# Patient Record
Sex: Female | Born: 1975
Health system: Southern US, Community
[De-identification: ages and names within clinical notes are randomized; demographics above are authoritative.]

## PROBLEM LIST (undated history)

## (undated) DIAGNOSIS — T7840XA Allergy, unspecified, initial encounter: Secondary | ICD-10-CM

## (undated) DIAGNOSIS — D696 Thrombocytopenia, unspecified: Secondary | ICD-10-CM

## (undated) DIAGNOSIS — O99345 Other mental disorders complicating the puerperium: Secondary | ICD-10-CM

## (undated) DIAGNOSIS — O99119 Other diseases of the blood and blood-forming organs and certain disorders involving the immune mechanism complicating pregnancy, unspecified trimester: Secondary | ICD-10-CM

## (undated) DIAGNOSIS — B009 Herpesviral infection, unspecified: Secondary | ICD-10-CM

## (undated) DIAGNOSIS — F53 Postpartum depression: Secondary | ICD-10-CM

## (undated) HISTORY — PX: MOUTH SURGERY: SHX715

## (undated) HISTORY — DX: Other mental disorders complicating the puerperium: O99.345

## (undated) HISTORY — DX: Postpartum depression: F53.0

## (undated) HISTORY — PX: WISDOM TOOTH EXTRACTION: SHX21

## (undated) HISTORY — DX: Thrombocytopenia, unspecified: D69.6

## (undated) HISTORY — DX: Other diseases of the blood and blood-forming organs and certain disorders involving the immune mechanism complicating pregnancy, unspecified trimester: O99.119

## (undated) HISTORY — DX: Allergy, unspecified, initial encounter: T78.40XA

---

## 1999-02-08 ENCOUNTER — Encounter: Payer: Self-pay | Admitting: Family Medicine

## 1999-02-08 ENCOUNTER — Ambulatory Visit (HOSPITAL_COMMUNITY): Admission: RE | Admit: 1999-02-08 | Discharge: 1999-02-08 | Payer: Self-pay | Admitting: Family Medicine

## 2002-12-14 ENCOUNTER — Emergency Department (HOSPITAL_COMMUNITY): Admission: EM | Admit: 2002-12-14 | Discharge: 2002-12-14 | Payer: Self-pay | Admitting: Emergency Medicine

## 2005-08-04 ENCOUNTER — Other Ambulatory Visit: Admission: RE | Admit: 2005-08-04 | Discharge: 2005-08-04 | Payer: Self-pay | Admitting: Gynecology

## 2006-12-11 ENCOUNTER — Other Ambulatory Visit: Admission: RE | Admit: 2006-12-11 | Discharge: 2006-12-11 | Payer: Self-pay | Admitting: Gynecology

## 2010-09-05 NOTE — L&D Delivery Note (Signed)
Delivery Note  Pt began to feel pressure, after epidural placed. Cervix complete, AROM for light mec stained fluid. FHR 130's, pt began pushing and vertex descended, FHR variables noted.  Vertex delivered, and shoulders followed with McRoberts position.   At 5:06 AM a viable female was delivered via Vaginal, Spontaneous Delivery (Presentation: ;Left  Occiput Anterior).  APGAR: 8, 9; weight 7 lb 13 oz (3544 g).   Placenta status: Intact, Spontaneous.  Cord: 3 vessels with the following complications: None.  Routine cord blood collected, Placenta sent to pathology, secondary to mec staining, and hydronephritis  Infant with spontaneous respirations, bulb suction for moderate mec stained fluid, cord and placenta stained as well.   Anesthesia: Epidural  Episiotomy: None Lacerations: bilateral labial Suture Repair: 3.0 vicryl rapide Est. Blood Loss (mL): 300  Mom to postpartum.  Baby to nursery-stable  Dr Stefano Gaul notified  Malissa Hippo 06/02/2011, 5:41 AM

## 2010-11-17 LAB — GC/CHLAMYDIA PROBE AMP, GENITAL
Chlamydia: NEGATIVE
Gonorrhea: NEGATIVE

## 2010-11-17 LAB — RUBELLA ANTIBODY, IGM: Rubella: IMMUNE

## 2010-11-17 LAB — HIV ANTIBODY (ROUTINE TESTING W REFLEX): HIV: NONREACTIVE

## 2011-05-24 ENCOUNTER — Other Ambulatory Visit (HOSPITAL_COMMUNITY): Payer: Self-pay | Admitting: Obstetrics and Gynecology

## 2011-05-24 DIAGNOSIS — O2690 Pregnancy related conditions, unspecified, unspecified trimester: Secondary | ICD-10-CM

## 2011-05-25 ENCOUNTER — Ambulatory Visit (HOSPITAL_COMMUNITY)
Admission: RE | Admit: 2011-05-25 | Discharge: 2011-05-25 | Disposition: A | Discharge status: Home / Self Care | Payer: Managed Care, Other (non HMO) | Source: Ambulatory Visit | Attending: Obstetrics and Gynecology | Admitting: Obstetrics and Gynecology

## 2011-05-25 ENCOUNTER — Encounter (HOSPITAL_COMMUNITY): Payer: Self-pay

## 2011-05-25 DIAGNOSIS — O358XX0 Maternal care for other (suspected) fetal abnormality and damage, not applicable or unspecified: Secondary | ICD-10-CM | POA: Insufficient documentation

## 2011-05-25 DIAGNOSIS — O2690 Pregnancy related conditions, unspecified, unspecified trimester: Secondary | ICD-10-CM

## 2011-05-31 ENCOUNTER — Other Ambulatory Visit: Payer: Self-pay | Admitting: Obstetrics and Gynecology

## 2011-05-31 ENCOUNTER — Encounter (HOSPITAL_COMMUNITY): Payer: Self-pay | Admitting: *Deleted

## 2011-05-31 ENCOUNTER — Inpatient Hospital Stay (HOSPITAL_COMMUNITY)
Admission: AD | Admit: 2011-05-31 | Discharge: 2011-05-31 | Disposition: A | Discharge status: Home / Self Care | Payer: Managed Care, Other (non HMO) | Source: Ambulatory Visit | Attending: Obstetrics and Gynecology | Admitting: Obstetrics and Gynecology

## 2011-05-31 ENCOUNTER — Inpatient Hospital Stay (HOSPITAL_COMMUNITY): Payer: Managed Care, Other (non HMO)

## 2011-05-31 DIAGNOSIS — O288 Other abnormal findings on antenatal screening of mother: Secondary | ICD-10-CM

## 2011-05-31 DIAGNOSIS — O368190 Decreased fetal movements, unspecified trimester, not applicable or unspecified: Secondary | ICD-10-CM | POA: Insufficient documentation

## 2011-05-31 NOTE — Progress Notes (Signed)
Pt sent over from office for non-reactive NST and decreased fetal movement.  Pt denies any bleeding, leaking of fluid, or pain.

## 2011-05-31 NOTE — ED Provider Notes (Signed)
History     Chief Complaint  Patient presents with  . Decreased Fetal Movement   HPI Comments: Pt was sent from office after a non-reactive NST and BPP 6/8 (-2 for breathing). Pt states baby was not moving well when seen at office.  She denies ctx, VB, LOF, has +FM now. Infant has hydronephritis and will be followed by pediactrics after birth.  Pt has a hx of HSV II   She denies ctx, VB, LOF, has +FM now. Infant has hydronephritis and will be followed by pediactrics after birth.     No past medical history on file.  Past Surgical History  Procedure Date  . Wisdom tooth extraction     Family History  Problem Relation Age of Onset  . Asthma Mother   . Hypertension Mother   . Hypertension Father     History  Substance Use Topics  . Smoking status: Former Games developer  . Smokeless tobacco: Not on file  . Alcohol Use: No    Allergies: No Known Allergies  Prescriptions prior to admission  Medication Sig Dispense Refill  . Calcium Carbonate Antacid (TUMS ULTRA 1000 PO) Take 1-2 tablets by mouth at bedtime as needed. For heartburn        . Cholecalciferol (VITAMIN D3) LIQD 3 drops by Does not apply route daily. Pure encapsulations vitamin d3 liquid, take 3 drops daily (1,000 units/drop)       . famciclovir (FAMVIR) 250 MG tablet Take 250 mg by mouth 2 (two) times daily.       . prenatal vitamin w/FE, FA (PRENATAL 1 + 1) 27-1 MG TABS Take 1 tablet by mouth daily.          Review of Systems  All other systems reviewed and are negative.   Physical Exam   Blood pressure 112/72, pulse 80, resp. rate 18, height 5\' 3"  (1.6 m), weight 64.411 kg (142 lb), last menstrual period 08/24/2010.  Physical Exam  Nursing note and vitals reviewed. Constitutional: She is oriented to person, place, and time. She appears well-developed and well-nourished.  Respiratory: Effort normal.  GI: Soft.  Neurological: She is alert and oriented to person, place, and time.  Skin: Skin is warm and dry.    Psychiatric: She has a normal mood and affect. Her behavior is normal.   FHR 150 reactive, mod variability, occ mild variable, CAT I toco irreg  VE deferred    MAU Course  Procedures NST BPP   Assessment and Plan  G2P0 at 39.6  Non-reactive NST at office FHR now reactive, will repeat BPP if WNL will D/C home Pt has F/U visit next Tuesday  Dr Su Hilt consulted  Jnyah Brazee M 05/31/2011, 4:33 PM    BPP 8/8 NST reactive FHR CAT I  Pt given FKC and labor precautions D/C home  Update Dr. Su Hilt

## 2011-06-01 ENCOUNTER — Inpatient Hospital Stay (HOSPITAL_COMMUNITY)
Admission: AD | Admit: 2011-06-01 | Discharge: 2011-06-01 | Disposition: A | Discharge status: Home / Self Care | Payer: Managed Care, Other (non HMO) | Source: Ambulatory Visit | Attending: Obstetrics and Gynecology | Admitting: Obstetrics and Gynecology

## 2011-06-01 ENCOUNTER — Encounter (HOSPITAL_COMMUNITY): Payer: Self-pay | Admitting: *Deleted

## 2011-06-01 DIAGNOSIS — O479 False labor, unspecified: Secondary | ICD-10-CM | POA: Insufficient documentation

## 2011-06-01 MED ORDER — ZOLPIDEM TARTRATE 10 MG PO TABS
10.0000 mg | ORAL_TABLET | Freq: Once | ORAL | Status: AC
  Administered 2011-06-01: 10 mg via ORAL
  Filled 2011-06-01: qty 1

## 2011-06-01 MED ORDER — ZOLPIDEM TARTRATE 10 MG PO TABS: 10.0000 mg | ORAL_TABLET | Freq: Every evening | ORAL | Status: DC | PRN

## 2011-06-01 NOTE — ED Provider Notes (Signed)
History     CSN: [id number] Arrival date & time: 06/01/2011  7:58 PM  No chief complaint on file.   (Consider location/radiation/quality/duration/timing/severity/associated sxs/prior treatment) HPI Comments: Pt arrived unannounced for a labor check. States ctx started about 1730 and are getting stronger. Denies VB, LOF, +FM Pregnancy is significant for hx HSVII, on valtrex, no prodromal sx's or outbreak at this time.    History reviewed. No pertinent past medical history.  Past Surgical History  Procedure Date  . Wisdom tooth extraction   . No past surgeries     Family History  Problem Relation Age of Onset  . Asthma Mother   . Hypertension Mother   . Hypertension Father     History  Substance Use Topics  . Smoking status: Former Games developer  . Smokeless tobacco: Not on file  . Alcohol Use: No    OB History    Grav Para Term Preterm Abortions TAB SAB Ect Mult Living   2 0 0 0 1 1 0 0 0 0       Review of Systems  All other systems reviewed and are negative.    Allergies  Review of patient's allergies indicates no known allergies.  Home Medications   Current Outpatient Rx  Name Route Sig Dispense Refill  . ZOLPIDEM TARTRATE 10 MG PO TABS Oral Take 1 tablet (10 mg total) by mouth at bedtime as needed for sleep. 30 tablet 0   Error, RX for ambien NOT given  BP 111/78  Pulse 72  Temp(Src) 97.5 F (36.4 C) (Oral)  Resp 18  Ht 5\' 3"  (1.6 m)  Wt 64.864 kg (143 lb)  BMI 25.33 kg/m2  LMP 08/24/2010  Physical Exam  Nursing note and vitals reviewed. Constitutional: She is oriented to person, place, and time. She appears well-developed and well-nourished.  Abdominal: Soft.  Genitourinary: Vagina normal.  Neurological: She is alert and oriented to person, place, and time.  Skin: Skin is warm and dry.  Psychiatric: She has a normal mood and affect. Her behavior is normal.    FHR 150 reactive CAT I toco irregular 4-6 VE =2/80/-1 No change after 1.5  hours    ED Course  Procedures NST VE  Labs Reviewed - No data to display        MDM  A: term IUP early/prodrome labor Hx HSVII no active lesions FHR reactive  P: D/C home ambien 10mg  now F/u routine

## 2011-06-01 NOTE — Progress Notes (Signed)
Patient states she started counting contractions about 1800 and pt states they slowed down about 3 0 mins ago but they are still coming

## 2011-06-01 NOTE — Progress Notes (Signed)
PT SAYS SHE WAS HERE YESTERDAY- EVALUATED- U/S AND SENT HOME.   VE 2 CM- X2 WEEKS.    SAYS HURT BAD AT 530PM.    CALLED SHELLEY AND LEFT MESSAGE.Marland Kitchen  SAYS BABY'S KIDNEY IS ABNL

## 2011-06-02 ENCOUNTER — Encounter (HOSPITAL_COMMUNITY): Payer: Self-pay | Admitting: Anesthesiology

## 2011-06-02 ENCOUNTER — Inpatient Hospital Stay (HOSPITAL_COMMUNITY)
Admission: AD | Admit: 2011-06-02 | Discharge: 2011-06-04 | DRG: 775 | Disposition: A | Discharge status: Home / Self Care | Payer: Managed Care, Other (non HMO) | Source: Ambulatory Visit | Attending: Obstetrics and Gynecology | Admitting: Obstetrics and Gynecology

## 2011-06-02 ENCOUNTER — Encounter (HOSPITAL_COMMUNITY): Payer: Self-pay | Admitting: *Deleted

## 2011-06-02 ENCOUNTER — Inpatient Hospital Stay (HOSPITAL_COMMUNITY): Payer: Managed Care, Other (non HMO) | Admitting: Anesthesiology

## 2011-06-02 ENCOUNTER — Other Ambulatory Visit: Payer: Self-pay | Admitting: Obstetrics and Gynecology

## 2011-06-02 LAB — CBC
Hemoglobin: 13.8 g/dL (ref 12.0–15.0)
MCH: 35.4 pg — ABNORMAL HIGH (ref 26.0–34.0)
RBC: 3.9 MIL/uL (ref 3.87–5.11)
WBC: 11.7 10*3/uL — ABNORMAL HIGH (ref 4.0–10.5)

## 2011-06-02 LAB — RPR: RPR Ser Ql: NONREACTIVE

## 2011-06-02 MED ORDER — IBUPROFEN 600 MG PO TABS
600.0000 mg | ORAL_TABLET | Freq: Four times a day (QID) | ORAL | Status: DC
  Administered 2011-06-02 – 2011-06-04 (×8): 600 mg via ORAL
  Filled 2011-06-02 (×5): qty 1

## 2011-06-02 MED ORDER — ZOLPIDEM TARTRATE 5 MG PO TABS: 5.0000 mg | ORAL_TABLET | Freq: Every evening | ORAL | Status: DC | PRN

## 2011-06-02 MED ORDER — FENTANYL CITRATE 0.05 MG/ML IJ SOLN
50.0000 ug | Freq: Once | INTRAMUSCULAR | Status: AC
  Administered 2011-06-02: 50 ug via INTRAVENOUS
  Filled 2011-06-02: qty 2

## 2011-06-02 MED ORDER — FENTANYL 2.5 MCG/ML BUPIVACAINE 1/10 % EPIDURAL INFUSION (WH - ANES)
14.0000 mL/h | INTRAMUSCULAR | Status: DC
  Administered 2011-06-02: 2 mL/h via EPIDURAL
  Administered 2011-06-02: 14 mL/h via EPIDURAL
  Filled 2011-06-02: qty 60

## 2011-06-02 MED ORDER — SENNOSIDES-DOCUSATE SODIUM 8.6-50 MG PO TABS
2.0000 | ORAL_TABLET | Freq: Every day | ORAL | Status: DC
  Administered 2011-06-03: 2 via ORAL

## 2011-06-02 MED ORDER — WITCH HAZEL-GLYCERIN EX PADS: 1.0000 "application " | MEDICATED_PAD | CUTANEOUS | Status: DC | PRN

## 2011-06-02 MED ORDER — MEASLES, MUMPS & RUBELLA VAC ~~LOC~~ INJ
0.5000 mL | INJECTION | Freq: Once | SUBCUTANEOUS | Status: DC
  Filled 2011-06-02: qty 0.5

## 2011-06-02 MED ORDER — ONDANSETRON HCL 4 MG PO TABS: 4.0000 mg | ORAL_TABLET | ORAL | Status: DC | PRN

## 2011-06-02 MED ORDER — DIPHENHYDRAMINE HCL 25 MG PO CAPS
25.0000 mg | ORAL_CAPSULE | Freq: Four times a day (QID) | ORAL | Status: DC | PRN
Start: 2011-06-02 — End: 2011-06-04

## 2011-06-02 MED ORDER — LANOLIN HYDROUS EX OINT: TOPICAL_OINTMENT | CUTANEOUS | Status: DC | PRN

## 2011-06-02 MED ORDER — OXYCODONE-ACETAMINOPHEN 5-325 MG PO TABS: 1.0000 | ORAL_TABLET | ORAL | Status: DC | PRN

## 2011-06-02 MED ORDER — CITRIC ACID-SODIUM CITRATE 334-500 MG/5ML PO SOLN: 30.0000 mL | ORAL | Status: DC | PRN

## 2011-06-02 MED ORDER — ONDANSETRON HCL 4 MG/2ML IJ SOLN: 4.0000 mg | INTRAMUSCULAR | Status: DC | PRN

## 2011-06-02 MED ORDER — OXYTOCIN BOLUS FROM INFUSION
500.0000 mL | Freq: Once | INTRAVENOUS | Status: AC
  Administered 2011-06-02: 500 mL via INTRAVENOUS
  Filled 2011-06-02: qty 500
  Filled 2011-06-02: qty 1000

## 2011-06-02 MED ORDER — EPHEDRINE 5 MG/ML INJ
10.0000 mg | INTRAVENOUS | Status: DC | PRN
  Filled 2011-06-02 (×2): qty 4

## 2011-06-02 MED ORDER — TETANUS-DIPHTH-ACELL PERTUSSIS 5-2.5-18.5 LF-MCG/0.5 IM SUSP
0.5000 mL | Freq: Once | INTRAMUSCULAR | Status: AC
  Administered 2011-06-03: 0.5 mL via INTRAMUSCULAR
  Filled 2011-06-02: qty 0.5

## 2011-06-02 MED ORDER — DIBUCAINE 1 % RE OINT: 1.0000 "application " | TOPICAL_OINTMENT | RECTAL | Status: DC | PRN

## 2011-06-02 MED ORDER — LACTATED RINGERS IV SOLN: 500.0000 mL | Freq: Once | INTRAVENOUS | Status: DC

## 2011-06-02 MED ORDER — OXYCODONE-ACETAMINOPHEN 5-325 MG PO TABS: 2.0000 | ORAL_TABLET | ORAL | Status: DC | PRN

## 2011-06-02 MED ORDER — BENZOCAINE-MENTHOL 20-0.5 % EX AERO
INHALATION_SPRAY | CUTANEOUS | Status: AC
  Administered 2011-06-02: 1 via TOPICAL
  Filled 2011-06-02: qty 56

## 2011-06-02 MED ORDER — IBUPROFEN 600 MG PO TABS
600.0000 mg | ORAL_TABLET | Freq: Four times a day (QID) | ORAL | Status: DC | PRN
  Administered 2011-06-02: 600 mg via ORAL
  Filled 2011-06-02 (×3): qty 1

## 2011-06-02 MED ORDER — DIPHENHYDRAMINE HCL 50 MG/ML IJ SOLN
12.5000 mg | INTRAMUSCULAR | Status: DC | PRN
Start: 2011-06-02 — End: 2011-06-04

## 2011-06-02 MED ORDER — LIDOCAINE HCL (PF) 1 % IJ SOLN
30.0000 mL | INTRAMUSCULAR | Status: AC | PRN
  Administered 2011-06-02: 30 mL via SUBCUTANEOUS
  Filled 2011-06-02: qty 30

## 2011-06-02 MED ORDER — SIMETHICONE 80 MG PO CHEW: 80.0000 mg | CHEWABLE_TABLET | ORAL | Status: DC | PRN

## 2011-06-02 MED ORDER — PHENYLEPHRINE 40 MCG/ML (10ML) SYRINGE FOR IV PUSH (FOR BLOOD PRESSURE SUPPORT)
80.0000 ug | PREFILLED_SYRINGE | INTRAVENOUS | Status: DC | PRN
  Filled 2011-06-02 (×2): qty 5

## 2011-06-02 MED ORDER — ACETAMINOPHEN 325 MG PO TABS: 650.0000 mg | ORAL_TABLET | ORAL | Status: DC | PRN

## 2011-06-02 MED ORDER — OXYTOCIN 10 UNIT/ML IJ SOLN: 10.0000 [IU] | Freq: Once | INTRAMUSCULAR | Status: DC

## 2011-06-02 MED ORDER — BENZOCAINE-MENTHOL 20-0.5 % EX AERO
1.0000 "application " | INHALATION_SPRAY | CUTANEOUS | Status: DC | PRN
  Administered 2011-06-02: 1 via TOPICAL

## 2011-06-02 MED ORDER — PHENYLEPHRINE 40 MCG/ML (10ML) SYRINGE FOR IV PUSH (FOR BLOOD PRESSURE SUPPORT)
80.0000 ug | PREFILLED_SYRINGE | INTRAVENOUS | Status: DC | PRN
  Filled 2011-06-02: qty 5

## 2011-06-02 MED ORDER — OXYTOCIN 20 UNITS IN LACTATED RINGERS INFUSION - SIMPLE: 125.0000 mL/h | Freq: Once | INTRAVENOUS | Status: DC

## 2011-06-02 MED ORDER — PRENATAL PLUS 27-1 MG PO TABS
1.0000 | ORAL_TABLET | Freq: Every day | ORAL | Status: DC
  Filled 2011-06-02: qty 1

## 2011-06-02 MED ORDER — ONDANSETRON HCL 4 MG/2ML IJ SOLN: 4.0000 mg | Freq: Four times a day (QID) | INTRAMUSCULAR | Status: DC | PRN

## 2011-06-02 MED ORDER — LACTATED RINGERS IV SOLN
INTRAVENOUS | Status: DC
  Administered 2011-06-02: 02:00:00 via INTRAVENOUS

## 2011-06-02 MED ORDER — LACTATED RINGERS IV SOLN
500.0000 mL | INTRAVENOUS | Status: DC | PRN
  Administered 2011-06-02: 500 mL via INTRAVENOUS

## 2011-06-02 MED ORDER — EPHEDRINE 5 MG/ML INJ
10.0000 mg | INTRAVENOUS | Status: DC | PRN
  Filled 2011-06-02: qty 4

## 2011-06-02 NOTE — Progress Notes (Signed)
Shelley Williams, CNM at bedside.  Assessment done. ve done.  poc discussed with pt.

## 2011-06-02 NOTE — Anesthesia Postprocedure Evaluation (Signed)
  Anesthesia Post-op Note  Patient: Shelley Williams  Procedure(s) Performed: * Lumbar Epidural for L&D *  Patient Location: Mother/Baby  Anesthesia Type: Epidural  Level of Consciousness: awake, alert  and oriented  Airway and Oxygen Therapy: Patient Spontanous Breathing  Post-op Pain: none  Post-op Assessment: Post-op Vital signs reviewed, Patient's Cardiovascular Status Stable, Respiratory Function Stable, Patent Airway, No signs of Nausea or vomiting, Adequate PO intake, Pain level controlled, No headache, No backache, No residual numbness and No residual motor weakness  Post-op Vital Signs: Reviewed and stable  Complications: No apparent anesthesia complications

## 2011-06-02 NOTE — Progress Notes (Signed)
Post Partum Day 0 Subjective: no complaints, up ad lib, voiding and tolerating PO. Bottlefeeding. Significant other incarcerated for the next three years. Patient likely to decline contraception.  Objective: Blood pressure 91/54, pulse 82, temperature 97.6 F (36.4 C), temperature source Oral, resp. rate 20, last menstrual period 08/24/2010, SpO2 100.00%, unknown if currently breastfeeding.  Physical Exam:  General: alert Lochia: appropriate Uterine Fundus: firm Incision: bilateral labials DVT Evaluation: No evidence of DVT seen on physical exam. Negative Homan's sign.   Basename 06/02/11 0135  HGB 13.8  HCT 39.3    Assessment/Plan: Day 0 postpartum stable. Continue current care.   LOS: 0 days   Nigel Bridgeman 06/02/2011, 10:52 AM

## 2011-06-02 NOTE — Progress Notes (Signed)
Pt presents to mau for labor check.  Left earlier and was 2cm.

## 2011-06-02 NOTE — Anesthesia Preprocedure Evaluation (Signed)
Anesthesia Evaluation  Name, MR# and DOB Patient awake  General Assessment Comment  Reviewed: Allergy & Precautions, H&P , NPO status , Patient's Chart, lab work & pertinent test results, reviewed documented beta blocker date and time   History of Anesthesia Complications Negative for: history of anesthetic complications  Airway Mallampati: III TM Distance: >3 FB Neck ROM: full    Dental  (+) Teeth Intact   Pulmonary  clear to auscultation  breath sounds clear to auscultation none    Cardiovascular regular Normal    Neuro/Psych Negative Neurological ROS  Negative Psych ROS  GI/Hepatic/Renal negative GI ROS  negative Liver ROS  negative Renal ROS        Endo/Other  Negative Endocrine ROS (+)      Abdominal   Musculoskeletal   Hematology negative hematology ROS (+)   Peds  Reproductive/Obstetrics (+) Pregnancy    Anesthesia Other Findings             Anesthesia Physical Anesthesia Plan  ASA: II  Anesthesia Plan: Epidural   Post-op Pain Management:    Induction:   Airway Management Planned:   Additional Equipment:   Intra-op Plan:   Post-operative Plan:   Informed Consent: I have reviewed the patients History and Physical, chart, labs and discussed the procedure including the risks, benefits and alternatives for the proposed anesthesia with the patient or authorized representative who has indicated his/her understanding and acceptance.     Plan Discussed with:   Anesthesia Plan Comments:         Anesthesia Quick Evaluation  

## 2011-06-02 NOTE — Anesthesia Procedure Notes (Signed)
Epidural Patient location during procedure: OB Start time: 06/02/2011 3:06 AM Reason for block: procedure for pain  Staffing Performed by: anesthesiologist   Preanesthetic Checklist Completed: patient identified, site marked, surgical consent, pre-op evaluation, timeout performed, IV checked, risks and benefits discussed and monitors and equipment checked  Epidural Patient position: sitting Prep: site prepped and draped and DuraPrep Patient monitoring: continuous pulse ox and blood pressure Approach: midline Injection technique: LOR air  Needle:  Needle type: Tuohy  Needle gauge: 17 G Needle length: 9 cm Needle insertion depth: 5 cm cm Catheter type: closed end flexible Catheter size: 19 Gauge Catheter at skin depth: 10 cm Test dose: negative  Assessment Events: blood not aspirated, injection not painful, no injection resistance, negative IV test and no paresthesia

## 2011-06-02 NOTE — H&P (Signed)
Shelley Williams is a 35 y.o. female presenting for ctx, pt was seen earlier in the evening and was sent home with [facility] after no cervical change. Pt returned with report of not resting and ctx continuing. Denies VB or LOF +FM.  Pt has been seen at [facility] with care beginning at 8wks, pt has been followed by the CNM's with MD/MFM consult secondary to R fetal hydronephritis.  Pregnancy significant for:  1. Hx HSV II, on acyclovir 2. Strong FM HTN 3. Previous smoker 4. Social issues during pregnancy 5. Low vit D 6. [place] S<D    Maternal Medical History:  Reason for admission: Reason for admission: contractions.  Contractions: Onset was 6-12 hours ago.   Frequency: regular.   Perceived severity is moderate.    Fetal activity: Perceived fetal activity is normal.   Last perceived fetal movement was within the past hour.      OB History    Grav Para Term Preterm Abortions TAB SAB Ect Mult Living   2 0 0 0 1 1 0 0 0 0      No past medical history on file. Past Surgical History  Procedure Date  . Wisdom tooth extraction   . No past surgeries    Family History: family history includes Asthma in her mother and Hypertension in her father and mother. Social History:  reports that she has quit smoking. She does not have any smokeless tobacco history on file. She reports that she does not drink alcohol or use illicit drugs.  Review of Systems  Psychiatric/Behavioral: Positive for depression. Negative for suicidal ideas.       S.O. incarcerated   All other systems reviewed and are negative.    Dilation: 4 Effacement (%): 100 Station: -1 (bulging bag. ) Exam by:: S. Adeola Dennen, CNM Blood pressure 119/79, pulse 109, temperature 99.4 F (37.4 C), temperature source Oral, resp. rate 18, last menstrual period 08/24/2010. Maternal Exam:  Uterine Assessment: Contraction strength is moderate.  Contraction frequency is regular.   Abdomen: Patient reports no abdominal tenderness. Fundal height is s<d.    Estimated fetal weight is 7#4oz per [place] on 9-17.   Fetal presentation: vertex  Introitus: Normal vulva. Vulva is negative for lesion.  Normal vagina.  Pelvis: adequate for delivery.   Cervix: Cervix evaluated by sterile speculum exam and digital exam.     Fetal Exam Fetal Monitor Review: Mode: ultrasound.   Baseline rate: 140.  Variability: minimal (<5 bpm).   Pattern: no accelerations.    Fetal State Assessment: Category II - tracings are indeterminate.     Physical Exam  Nursing note and vitals reviewed. Constitutional: She is oriented to person, place, and time. She appears well-developed and well-nourished.  Cardiovascular: Normal rate.   Respiratory: Effort normal.  GI: Soft.  Genitourinary: Vagina normal. Vulva exhibits no lesion.       Spec exam and external exam, no lesions noted  Musculoskeletal: Normal range of motion.  Neurological: She is alert and oriented to person, place, and time.  Skin: Skin is warm and dry.  Psychiatric: She has a normal mood and affect. Her behavior is normal.    Prenatal labs: ABO, Rh:  A pos Antibody:  neg Rubella:  imm RPR:   NR HBsAg:   neg HIV:   neg GBS:   neg Pap neg GC/CT neg Sickle cell neg Vit D 16 Hgb/Hct 12.6/37.4 Quad - WNL Labs at 28wks: Hgb 10.8 RPR - NR  1hr gtt - 95   Assessment/Plan: A:  Term IUP active labor Hx HSV GBS neg FHR non-reactive at this time Desires epidural R fetal hydronephritis  P: admit to birthing suites Routine CNM orders Consult neonatal/urology after delivery Epidural PRN  Will notify DR Einar Pheasant 06/02/2011, 1:48 AM

## 2011-06-02 NOTE — Progress Notes (Signed)
Will call back with room assignment.  

## 2011-06-03 LAB — CBC
HCT: 34.8 % — ABNORMAL LOW (ref 36.0–46.0)
MCV: 100.9 fL — ABNORMAL HIGH (ref 78.0–100.0)
RBC: 3.45 MIL/uL — ABNORMAL LOW (ref 3.87–5.11)
WBC: 10 10*3/uL (ref 4.0–10.5)

## 2011-06-03 NOTE — Anesthesia Postprocedure Evaluation (Signed)
  Anesthesia Post-op Note  Patient: Shelley Williams  Procedure(s) Performed: * No procedures listed *  Patient Location: PACU and Mother/Baby  Anesthesia Type: Epidural  Level of Consciousness: awake, alert , oriented and patient cooperative  Airway and Oxygen Therapy: Patient Spontanous Breathing  Post-op Pain: none  Post-op Assessment: Post-op Vital signs reviewed, Patient's Cardiovascular Status Stable, Respiratory Function Stable, No signs of Nausea or vomiting, Adequate PO intake and Pain level controlled  Post-op Vital Signs: Reviewed and stable  Complications: No apparent anesthesia complications

## 2011-06-03 NOTE — Addendum Note (Signed)
Addendum  created 06/03/11 0758 by Suella Grove   Modules edited:Charges VN, Notes Section

## 2011-06-03 NOTE — Progress Notes (Signed)
Post Partum Day 1 Subjective: no complaints, up ad lib, voiding, tolerating PO and bottlefeeding  Objective: Blood pressure 99/64, pulse 71, temperature 98.2 F (36.8 C), temperature source Oral, resp. rate 18, last menstrual period 08/24/2010, SpO2 100.00%, unknown if currently breastfeeding.  Physical Exam:  General: alert Lochia: appropriate Uterine Fundus: firm Incision: healing well, labial DVT Evaluation: No evidence of DVT seen on physical exam. Negative Homan's sign.   Basename 06/03/11 0557 06/02/11 0135  HGB 12.2 13.8  HCT 34.8* 39.3    Assessment/Plan: Plan for discharge tomorrow and Contraception -undecided   LOS: 1 day   Nigel Bridgeman 06/03/2011, 8:44 AM

## 2011-06-04 MED ORDER — IBUPROFEN 600 MG PO TABS: 600.0000 mg | ORAL_TABLET | Freq: Four times a day (QID) | ORAL | Status: AC | PRN

## 2011-06-04 NOTE — Discharge Summary (Signed)
Obstetric Discharge Summary Reason for Admission: onset of labor Prenatal Procedures: ultrasound Intrapartum Procedures: spontaneous vaginal delivery Postpartum Procedures: none Complications-Operative and Postpartum: 1st degree bilateral labial lacerations degree perineal laceration Hemoglobin  Date Value Range Status  06/03/2011 12.2  12.0-15.0 (g/dL) Final     HCT  Date Value Range Status  06/03/2011 34.8* 36.0-46.0 (%) Final    Discharge Diagnoses: Term Pregnancy-delivered                                         Fetal renal right hydronephrosis                                           Discharge Information: Date: 06/04/2011 Activity: Per [facility] handout Diet: routine Medications: Ibuprofen Condition: stable Instructions: refer to practice specific booklet Discharge to: home Contraception:  Declined due to partner's incarceration  Follow-up Information    Follow up with [facility] in 6 weeks.         Newborn Data: Live born female  Birth Weight: 7 lb 13 oz (3544 g) APGAR: 8, 9  Home with mother.  Nigel Bridgeman 06/04/2011, 7:45 AM

## 2014-06-09 ENCOUNTER — Other Ambulatory Visit (HOSPITAL_COMMUNITY): Payer: Self-pay | Admitting: Family Medicine

## 2014-06-09 DIAGNOSIS — Z3490 Encounter for supervision of normal pregnancy, unspecified, unspecified trimester: Secondary | ICD-10-CM

## 2014-06-09 DIAGNOSIS — N926 Irregular menstruation, unspecified: Secondary | ICD-10-CM

## 2014-06-20 ENCOUNTER — Encounter (HOSPITAL_COMMUNITY): Payer: Self-pay

## 2014-06-20 ENCOUNTER — Other Ambulatory Visit (HOSPITAL_COMMUNITY): Payer: Self-pay | Admitting: Family Medicine

## 2014-06-20 ENCOUNTER — Ambulatory Visit (HOSPITAL_COMMUNITY)
Admission: RE | Admit: 2014-06-20 | Discharge: 2014-06-20 | Disposition: A | Discharge status: Home / Self Care | Payer: Managed Care, Other (non HMO) | Source: Ambulatory Visit | Attending: Family Medicine | Admitting: Family Medicine

## 2014-06-20 DIAGNOSIS — Z3A01 Less than 8 weeks gestation of pregnancy: Secondary | ICD-10-CM | POA: Diagnosis not present

## 2014-06-20 DIAGNOSIS — O3481 Maternal care for other abnormalities of pelvic organs, first trimester: Secondary | ICD-10-CM | POA: Insufficient documentation

## 2014-06-20 DIAGNOSIS — N831 Corpus luteum cyst: Secondary | ICD-10-CM | POA: Diagnosis not present

## 2014-06-20 DIAGNOSIS — Z36 Encounter for antenatal screening of mother: Secondary | ICD-10-CM | POA: Diagnosis present

## 2014-06-20 DIAGNOSIS — Z3490 Encounter for supervision of normal pregnancy, unspecified, unspecified trimester: Secondary | ICD-10-CM

## 2014-06-20 DIAGNOSIS — N926 Irregular menstruation, unspecified: Secondary | ICD-10-CM

## 2014-07-04 ENCOUNTER — Other Ambulatory Visit: Payer: Self-pay

## 2014-07-07 ENCOUNTER — Encounter (HOSPITAL_COMMUNITY): Payer: Self-pay

## 2014-07-25 LAB — OB RESULTS CONSOLE ABO/RH: RH Type: POSITIVE

## 2014-07-25 LAB — OB RESULTS CONSOLE RPR: RPR: NONREACTIVE

## 2014-07-25 LAB — OB RESULTS CONSOLE ANTIBODY SCREEN: ANTIBODY SCREEN: NEGATIVE

## 2014-07-25 LAB — OB RESULTS CONSOLE HEPATITIS B SURFACE ANTIGEN: Hepatitis B Surface Ag: NEGATIVE

## 2014-07-25 LAB — OB RESULTS CONSOLE HIV ANTIBODY (ROUTINE TESTING): HIV: NONREACTIVE

## 2014-07-25 LAB — OB RESULTS CONSOLE RUBELLA ANTIBODY, IGM: RUBELLA: IMMUNE

## 2014-09-05 NOTE — L&D Delivery Note (Signed)
Delivery Note At 8:32 AM a viable and healthy female was delivered via Vaginal, Spontaneous Delivery (Presentation: Left Occiput Anterior).  APGAR: 9, 9; weight pending .   Placenta status: Intact, Spontaneous.  Cord: 3 vessels with a loose nuchal cord that was reduced on the perineum  Anesthesia: Epidural  Episiotomy: None Lacerations: None Suture Repair: n/a Est. Blood Loss (mL): 330  Mom to postpartum.  Baby to Couplet care / Skin to Skin.  The patient desires a postpartum tubal ligation. Risks/benefits/alternatives were discussed with the patient at length. Once her postpartum recovery has been completed we will proceed with tubal ligation  Sausha Raymond H. 02/01/2015, 8:57 AM

## 2014-11-27 ENCOUNTER — Other Ambulatory Visit: Payer: Self-pay | Admitting: Obstetrics and Gynecology

## 2015-01-09 ENCOUNTER — Other Ambulatory Visit (HOSPITAL_COMMUNITY): Payer: Self-pay | Admitting: Obstetrics and Gynecology

## 2015-01-09 LAB — OB RESULTS CONSOLE GBS: GBS: POSITIVE

## 2015-01-31 ENCOUNTER — Encounter (HOSPITAL_COMMUNITY): Payer: Self-pay | Admitting: *Deleted

## 2015-01-31 ENCOUNTER — Inpatient Hospital Stay (HOSPITAL_COMMUNITY)
Admission: AD | Admit: 2015-01-31 | Discharge: 2015-02-02 | DRG: 767 | Disposition: A | Discharge status: Home / Self Care | Payer: Managed Care, Other (non HMO) | Source: Ambulatory Visit | Attending: Obstetrics and Gynecology | Admitting: Obstetrics and Gynecology

## 2015-01-31 DIAGNOSIS — O9832 Other infections with a predominantly sexual mode of transmission complicating childbirth: Principal | ICD-10-CM | POA: Diagnosis present

## 2015-01-31 DIAGNOSIS — Z3A39 39 weeks gestation of pregnancy: Secondary | ICD-10-CM | POA: Diagnosis present

## 2015-01-31 DIAGNOSIS — A6000 Herpesviral infection of urogenital system, unspecified: Secondary | ICD-10-CM | POA: Diagnosis present

## 2015-01-31 DIAGNOSIS — O9989 Other specified diseases and conditions complicating pregnancy, childbirth and the puerperium: Secondary | ICD-10-CM | POA: Diagnosis present

## 2015-01-31 DIAGNOSIS — IMO0001 Reserved for inherently not codable concepts without codable children: Secondary | ICD-10-CM

## 2015-01-31 DIAGNOSIS — Z302 Encounter for sterilization: Secondary | ICD-10-CM

## 2015-01-31 DIAGNOSIS — N736 Female pelvic peritoneal adhesions (postinfective): Secondary | ICD-10-CM | POA: Diagnosis present

## 2015-01-31 DIAGNOSIS — O351XX0 Maternal care for (suspected) chromosomal abnormality in fetus, not applicable or unspecified: Secondary | ICD-10-CM | POA: Diagnosis present

## 2015-01-31 DIAGNOSIS — Z87891 Personal history of nicotine dependence: Secondary | ICD-10-CM

## 2015-01-31 DIAGNOSIS — O09523 Supervision of elderly multigravida, third trimester: Secondary | ICD-10-CM

## 2015-01-31 DIAGNOSIS — O9902 Anemia complicating childbirth: Secondary | ICD-10-CM | POA: Diagnosis present

## 2015-01-31 DIAGNOSIS — O99824 Streptococcus B carrier state complicating childbirth: Secondary | ICD-10-CM | POA: Diagnosis present

## 2015-01-31 HISTORY — DX: Herpesviral infection, unspecified: B00.9

## 2015-01-31 NOTE — MAU Note (Signed)
Contractions on and off all day. More regular since 2030. Denies bleeding or LOF. Thinks lost mucous plug tonight

## 2015-02-01 ENCOUNTER — Inpatient Hospital Stay (HOSPITAL_COMMUNITY): Payer: Managed Care, Other (non HMO)

## 2015-02-01 ENCOUNTER — Inpatient Hospital Stay (HOSPITAL_COMMUNITY): Payer: Managed Care, Other (non HMO) | Admitting: Anesthesiology

## 2015-02-01 ENCOUNTER — Encounter (HOSPITAL_COMMUNITY): Payer: Self-pay | Admitting: *Deleted

## 2015-02-01 ENCOUNTER — Encounter (HOSPITAL_COMMUNITY)
Admission: AD | Disposition: A | Discharge status: Home / Self Care | Payer: Self-pay | Source: Ambulatory Visit | Attending: Obstetrics and Gynecology

## 2015-02-01 DIAGNOSIS — O9989 Other specified diseases and conditions complicating pregnancy, childbirth and the puerperium: Secondary | ICD-10-CM | POA: Diagnosis present

## 2015-02-01 DIAGNOSIS — Z3A39 39 weeks gestation of pregnancy: Secondary | ICD-10-CM | POA: Diagnosis present

## 2015-02-01 DIAGNOSIS — O9902 Anemia complicating childbirth: Secondary | ICD-10-CM | POA: Diagnosis present

## 2015-02-01 DIAGNOSIS — Z87891 Personal history of nicotine dependence: Secondary | ICD-10-CM | POA: Diagnosis not present

## 2015-02-01 DIAGNOSIS — O351XX0 Maternal care for (suspected) chromosomal abnormality in fetus, not applicable or unspecified: Secondary | ICD-10-CM | POA: Diagnosis present

## 2015-02-01 DIAGNOSIS — A6000 Herpesviral infection of urogenital system, unspecified: Secondary | ICD-10-CM | POA: Diagnosis present

## 2015-02-01 DIAGNOSIS — N736 Female pelvic peritoneal adhesions (postinfective): Secondary | ICD-10-CM | POA: Diagnosis present

## 2015-02-01 DIAGNOSIS — N858 Other specified noninflammatory disorders of uterus: Secondary | ICD-10-CM | POA: Diagnosis present

## 2015-02-01 DIAGNOSIS — O09523 Supervision of elderly multigravida, third trimester: Secondary | ICD-10-CM | POA: Diagnosis not present

## 2015-02-01 DIAGNOSIS — Z302 Encounter for sterilization: Secondary | ICD-10-CM | POA: Diagnosis not present

## 2015-02-01 DIAGNOSIS — IMO0001 Reserved for inherently not codable concepts without codable children: Secondary | ICD-10-CM

## 2015-02-01 DIAGNOSIS — O99824 Streptococcus B carrier state complicating childbirth: Secondary | ICD-10-CM | POA: Diagnosis present

## 2015-02-01 DIAGNOSIS — O9832 Other infections with a predominantly sexual mode of transmission complicating childbirth: Secondary | ICD-10-CM | POA: Diagnosis present

## 2015-02-01 HISTORY — PX: TUBAL LIGATION: SHX77

## 2015-02-01 LAB — CBC
HCT: 34.4 % — ABNORMAL LOW (ref 36.0–46.0)
HCT: 33.3 % — ABNORMAL LOW (ref 36.0–46.0)
HEMOGLOBIN: 11.6 g/dL — AB (ref 12.0–15.0)
Hemoglobin: 12 g/dL (ref 12.0–15.0)
MCH: 34.4 pg — AB (ref 26.0–34.0)
MCH: 34.3 pg — ABNORMAL HIGH (ref 26.0–34.0)
MCHC: 34.9 g/dL (ref 30.0–36.0)
MCHC: 34.8 g/dL (ref 30.0–36.0)
MCV: 98.6 fL (ref 78.0–100.0)
MCV: 98.5 fL (ref 78.0–100.0)
Platelets: 139 10*3/uL — ABNORMAL LOW (ref 150–400)
Platelets: 132 10*3/uL — ABNORMAL LOW (ref 150–400)
RBC: 3.49 MIL/uL — ABNORMAL LOW (ref 3.87–5.11)
RBC: 3.38 MIL/uL — AB (ref 3.87–5.11)
RDW: 14.4 % (ref 11.5–15.5)
RDW: 14.4 % (ref 11.5–15.5)
WBC: 9.4 10*3/uL (ref 4.0–10.5)
WBC: 14.9 10*3/uL — ABNORMAL HIGH (ref 4.0–10.5)

## 2015-02-01 LAB — TYPE AND SCREEN
ABO/RH(D): A POS
ANTIBODY SCREEN: NEGATIVE

## 2015-02-01 LAB — ABO/RH: ABO/RH(D): A POS

## 2015-02-01 LAB — RPR: RPR Ser Ql: NONREACTIVE

## 2015-02-01 SURGERY — LIGATION, FALLOPIAN TUBE, POSTPARTUM
Anesthesia: Epidural | Site: Abdomen | Laterality: Bilateral

## 2015-02-01 MED ORDER — LIDOCAINE-EPINEPHRINE (PF) 2 %-1:200000 IJ SOLN
INTRAMUSCULAR | Status: AC
  Filled 2015-02-01: qty 20

## 2015-02-01 MED ORDER — FENTANYL 2.5 MCG/ML BUPIVACAINE 1/10 % EPIDURAL INFUSION (WH - ANES)
INTRAMUSCULAR | Status: DC | PRN
  Administered 2015-02-01: 14 mL/h via EPIDURAL

## 2015-02-01 MED ORDER — OXYCODONE-ACETAMINOPHEN 5-325 MG PO TABS: 1.0000 | ORAL_TABLET | ORAL | Status: DC | PRN

## 2015-02-01 MED ORDER — LIDOCAINE HCL (CARDIAC) 20 MG/ML IV SOLN
INTRAVENOUS | Status: AC
  Filled 2015-02-01: qty 5

## 2015-02-01 MED ORDER — TETANUS-DIPHTH-ACELL PERTUSSIS 5-2.5-18.5 LF-MCG/0.5 IM SUSP: 0.5000 mL | Freq: Once | INTRAMUSCULAR | Status: DC

## 2015-02-01 MED ORDER — ZOLPIDEM TARTRATE 5 MG PO TABS: 5.0000 mg | ORAL_TABLET | Freq: Every evening | ORAL | Status: DC | PRN

## 2015-02-01 MED ORDER — ONDANSETRON HCL 4 MG/2ML IJ SOLN
4.0000 mg | Freq: Four times a day (QID) | INTRAMUSCULAR | Status: DC | PRN
  Administered 2015-02-01 (×2): 4 mg via INTRAVENOUS
  Filled 2015-02-01: qty 2

## 2015-02-01 MED ORDER — MIDAZOLAM HCL 5 MG/5ML IJ SOLN
INTRAMUSCULAR | Status: DC | PRN
  Administered 2015-02-01 (×2): 1 mg via INTRAVENOUS

## 2015-02-01 MED ORDER — DIBUCAINE 1 % RE OINT: 1.0000 "application " | TOPICAL_OINTMENT | RECTAL | Status: DC | PRN

## 2015-02-01 MED ORDER — PROPOFOL 10 MG/ML IV BOLUS
INTRAVENOUS | Status: AC
  Filled 2015-02-01: qty 20

## 2015-02-01 MED ORDER — OXYCODONE HCL 5 MG/5ML PO SOLN: 5.0000 mg | Freq: Once | ORAL | Status: DC | PRN

## 2015-02-01 MED ORDER — OXYCODONE HCL 5 MG PO TABS: 5.0000 mg | ORAL_TABLET | Freq: Once | ORAL | Status: DC | PRN

## 2015-02-01 MED ORDER — METHYLERGONOVINE MALEATE 0.2 MG PO TABS: 0.2000 mg | ORAL_TABLET | ORAL | Status: DC | PRN

## 2015-02-01 MED ORDER — PHENYLEPHRINE 40 MCG/ML (10ML) SYRINGE FOR IV PUSH (FOR BLOOD PRESSURE SUPPORT)
PREFILLED_SYRINGE | INTRAVENOUS | Status: AC
  Filled 2015-02-01: qty 20

## 2015-02-01 MED ORDER — PRENATAL MULTIVITAMIN CH
1.0000 | ORAL_TABLET | Freq: Every day | ORAL | Status: DC
  Administered 2015-02-01 – 2015-02-02 (×2): 1 via ORAL
  Filled 2015-02-01 (×2): qty 1

## 2015-02-01 MED ORDER — FENTANYL 2.5 MCG/ML BUPIVACAINE 1/10 % EPIDURAL INFUSION (WH - ANES)
INTRAMUSCULAR | Status: AC
  Administered 2015-02-01: 03:00:00
  Filled 2015-02-01: qty 125

## 2015-02-01 MED ORDER — ACETAMINOPHEN 325 MG PO TABS: 650.0000 mg | ORAL_TABLET | ORAL | Status: DC | PRN

## 2015-02-01 MED ORDER — WITCH HAZEL-GLYCERIN EX PADS: 1.0000 "application " | MEDICATED_PAD | CUTANEOUS | Status: DC | PRN

## 2015-02-01 MED ORDER — CITRIC ACID-SODIUM CITRATE 334-500 MG/5ML PO SOLN
30.0000 mL | ORAL | Status: DC | PRN
  Administered 2015-02-01 (×2): 30 mL via ORAL
  Filled 2015-02-01 (×2): qty 15

## 2015-02-01 MED ORDER — PENICILLIN G POTASSIUM 5000000 UNITS IJ SOLR
2.5000 10*6.[IU] | INTRAVENOUS | Status: DC
  Administered 2015-02-01: 2.5 10*6.[IU] via INTRAVENOUS
  Filled 2015-02-01 (×3): qty 2.5

## 2015-02-01 MED ORDER — OXYTOCIN BOLUS FROM INFUSION
500.0000 mL | INTRAVENOUS | Status: DC
  Administered 2015-02-01: 500 mL via INTRAVENOUS

## 2015-02-01 MED ORDER — BUTORPHANOL TARTRATE 1 MG/ML IJ SOLN: 1.0000 mg | INTRAMUSCULAR | Status: DC | PRN

## 2015-02-01 MED ORDER — FENTANYL CITRATE (PF) 100 MCG/2ML IJ SOLN
INTRAMUSCULAR | Status: AC
  Filled 2015-02-01: qty 2

## 2015-02-01 MED ORDER — ONDANSETRON HCL 4 MG/2ML IJ SOLN
4.0000 mg | INTRAMUSCULAR | Status: DC | PRN
Start: 2015-02-01 — End: 2015-02-02

## 2015-02-01 MED ORDER — FENTANYL CITRATE (PF) 100 MCG/2ML IJ SOLN
INTRAMUSCULAR | Status: DC | PRN
  Administered 2015-02-01 (×2): 50 ug via INTRAVENOUS

## 2015-02-01 MED ORDER — FENTANYL CITRATE (PF) 100 MCG/2ML IJ SOLN: 25.0000 ug | INTRAMUSCULAR | Status: DC | PRN

## 2015-02-01 MED ORDER — OXYTOCIN 40 UNITS IN LACTATED RINGERS INFUSION - SIMPLE MED
62.5000 mL/h | INTRAVENOUS | Status: DC
  Filled 2015-02-01: qty 1000

## 2015-02-01 MED ORDER — FLEET ENEMA 7-19 GM/118ML RE ENEM: 1.0000 | ENEMA | Freq: Every day | RECTAL | Status: DC | PRN

## 2015-02-01 MED ORDER — OXYCODONE-ACETAMINOPHEN 5-325 MG PO TABS: 2.0000 | ORAL_TABLET | ORAL | Status: DC | PRN

## 2015-02-01 MED ORDER — OXYCODONE-ACETAMINOPHEN 5-325 MG PO TABS
2.0000 | ORAL_TABLET | ORAL | Status: DC | PRN
  Administered 2015-02-01 – 2015-02-02 (×3): 2 via ORAL
  Filled 2015-02-01 (×3): qty 2

## 2015-02-01 MED ORDER — BENZOCAINE-MENTHOL 20-0.5 % EX AERO
1.0000 "application " | INHALATION_SPRAY | CUTANEOUS | Status: DC | PRN
  Filled 2015-02-01: qty 56

## 2015-02-01 MED ORDER — LIDOCAINE HCL (PF) 1 % IJ SOLN
INTRAMUSCULAR | Status: DC | PRN
  Administered 2015-02-01 (×2): 4 mL

## 2015-02-01 MED ORDER — SIMETHICONE 80 MG PO CHEW: 80.0000 mg | CHEWABLE_TABLET | ORAL | Status: DC | PRN

## 2015-02-01 MED ORDER — LIDOCAINE HCL (PF) 1 % IJ SOLN
30.0000 mL | INTRAMUSCULAR | Status: DC | PRN
  Filled 2015-02-01: qty 30

## 2015-02-01 MED ORDER — SODIUM BICARBONATE 8.4 % IV SOLN
INTRAVENOUS | Status: AC
  Filled 2015-02-01: qty 50

## 2015-02-01 MED ORDER — SENNOSIDES-DOCUSATE SODIUM 8.6-50 MG PO TABS
2.0000 | ORAL_TABLET | ORAL | Status: DC
  Administered 2015-02-01: 2 via ORAL
  Filled 2015-02-01: qty 2

## 2015-02-01 MED ORDER — BUPIVACAINE HCL (PF) 0.25 % IJ SOLN
INTRAMUSCULAR | Status: DC | PRN
  Administered 2015-02-01: 20 mL

## 2015-02-01 MED ORDER — METHYLERGONOVINE MALEATE 0.2 MG/ML IJ SOLN: 0.2000 mg | INTRAMUSCULAR | Status: DC | PRN

## 2015-02-01 MED ORDER — LANOLIN HYDROUS EX OINT: TOPICAL_OINTMENT | CUTANEOUS | Status: DC | PRN

## 2015-02-01 MED ORDER — IBUPROFEN 600 MG PO TABS
600.0000 mg | ORAL_TABLET | Freq: Four times a day (QID) | ORAL | Status: DC
  Administered 2015-02-01 – 2015-02-02 (×4): 600 mg via ORAL
  Filled 2015-02-01 (×4): qty 1

## 2015-02-01 MED ORDER — ACETAMINOPHEN 325 MG PO TABS: 325.0000 mg | ORAL_TABLET | ORAL | Status: DC | PRN

## 2015-02-01 MED ORDER — PENICILLIN G POTASSIUM 5000000 UNITS IJ SOLR
5.0000 10*6.[IU] | Freq: Once | INTRAMUSCULAR | Status: AC
  Administered 2015-02-01: 5 10*6.[IU] via INTRAVENOUS
  Filled 2015-02-01: qty 5

## 2015-02-01 MED ORDER — LACTATED RINGERS IV SOLN: INTRAVENOUS | Status: DC

## 2015-02-01 MED ORDER — LACTATED RINGERS IV SOLN
INTRAVENOUS | Status: DC
  Administered 2015-02-01 (×5): via INTRAVENOUS

## 2015-02-01 MED ORDER — PROPOFOL 10 MG/ML IV BOLUS
INTRAVENOUS | Status: DC | PRN
  Administered 2015-02-01 (×4): 10 mg via INTRAVENOUS

## 2015-02-01 MED ORDER — MIDAZOLAM HCL 2 MG/2ML IJ SOLN
INTRAMUSCULAR | Status: AC
  Filled 2015-02-01: qty 2

## 2015-02-01 MED ORDER — ONDANSETRON HCL 4 MG PO TABS: 4.0000 mg | ORAL_TABLET | ORAL | Status: DC | PRN

## 2015-02-01 MED ORDER — ACETAMINOPHEN 160 MG/5ML PO SOLN: 325.0000 mg | ORAL | Status: DC | PRN

## 2015-02-01 MED ORDER — ONDANSETRON HCL 4 MG/2ML IJ SOLN
INTRAMUSCULAR | Status: AC
  Filled 2015-02-01: qty 2

## 2015-02-01 MED ORDER — LACTATED RINGERS IV SOLN: 500.0000 mL | INTRAVENOUS | Status: DC | PRN

## 2015-02-01 MED ORDER — DIPHENHYDRAMINE HCL 25 MG PO CAPS: 25.0000 mg | ORAL_CAPSULE | Freq: Four times a day (QID) | ORAL | Status: DC | PRN

## 2015-02-01 MED ORDER — BUPIVACAINE HCL (PF) 0.25 % IJ SOLN
INTRAMUSCULAR | Status: AC
  Filled 2015-02-01: qty 30

## 2015-02-01 MED ORDER — SODIUM BICARBONATE 8.4 % IV SOLN
INTRAVENOUS | Status: DC | PRN
  Administered 2015-02-01: 8 mL via EPIDURAL
  Administered 2015-02-01: 2 mL via EPIDURAL
  Administered 2015-02-01 (×2): 5 mL via EPIDURAL

## 2015-02-01 SURGICAL SUPPLY — 20 items
CLIP FILSHIE TUBAL LIGA STRL (Clip) ×3 IMPLANT
CLOTH BEACON ORANGE TIMEOUT ST (SAFETY) ×3 IMPLANT
DRSG OPSITE POSTOP 3X4 (GAUZE/BANDAGES/DRESSINGS) ×3 IMPLANT
ELECT REM PT RETURN 9FT ADLT (ELECTROSURGICAL)
ELECTRODE REM PT RTRN 9FT ADLT (ELECTROSURGICAL) IMPLANT
GLOVE BIO SURGEON STRL SZ7 (GLOVE) ×3 IMPLANT
LIQUID BAND (GAUZE/BANDAGES/DRESSINGS) ×3 IMPLANT
NEEDLE HYPO 22GX1.5 SAFETY (NEEDLE) ×3 IMPLANT
NS IRRIG 1000ML POUR BTL (IV SOLUTION) ×3 IMPLANT
PACK ABDOMINAL MINOR (CUSTOM PROCEDURE TRAY) ×3 IMPLANT
PENCIL BUTTON HOLSTER BLD 10FT (ELECTRODE) ×3 IMPLANT
SPONGE LAP 4X18 X RAY DECT (DISPOSABLE) IMPLANT
SUT PLAIN 2 0 (SUTURE)
SUT PLAIN ABS 2-0 54XMFL TIE (SUTURE) IMPLANT
SUT VICRYL 0 UR6 27IN ABS (SUTURE) ×3 IMPLANT
SUT VICRYL 4-0 PS2 18IN ABS (SUTURE) ×3 IMPLANT
SYR CONTROL 10ML LL (SYRINGE) ×3 IMPLANT
TOWEL OR 17X24 6PK STRL BLUE (TOWEL DISPOSABLE) ×6 IMPLANT
TRAY FOLEY CATH SILVER 14FR (SET/KITS/TRAYS/PACK) ×3 IMPLANT
WATER STERILE IRR 1000ML POUR (IV SOLUTION) ×3 IMPLANT

## 2015-02-01 NOTE — Op Note (Addendum)
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 [name] Assistant: None Anesthesia: Epidural and 0.25% Marcaine injected locally Operative Findings: Normal right fallopian tube, left fallopian tube with adhesions to the omentum. Adhesive disease involving the left tube and omentum Specimen: None EBL: Minimal  Shelley Williams is a 39 yo G3P2012 s/p a term spontaneous vaginal delivery who desires permanent sterilization.  Risks, benefits, and alternatives of the procedure were reviewed with the patient.  Following the appropriate informed consent the patient was taken to the OR where adequate surgical anesthesia was confirmed.  After a time-out procedure was performed that appropriately identified the patient the abdomen was prepped and draped in the normal sterile fashion.  A semilunal infraumbilical skin incision was made with the scalpel and the underlying subcutaneous tissue was dissected bluntly to the level of the fascia. The peritoneum was incised and entry into the abdomen was confirmed with palpation of the fundus.  The patient was tilted to the left and the right fallopian tube was identified, grasped with a babcock clamp, and followed out to the fimbriated end.  The filshie clip was applied to the proximal portion of the fallopian tube and retuned to the abdominal cavity.  The patient was then tilted to the right and the same procedure was repeated on the left fallopian tube. Adhesive disease between the left tube and omentum was taken down with the bovie. The fascia was closed with 0 vicryl in a running fashion.  The skin was closed with 4-0 vicryl in a subcuticular fashion.  All sponge, lap, and needle counts were correct x 2.  The patient tolerated the procedure well and was brought to the  recovery room in stable condition.

## 2015-02-01 NOTE — Anesthesia Preprocedure Evaluation (Signed)
Anesthesia Evaluation  Patient identified by MRN, date of birth, ID band Patient awake    Reviewed: Allergy & Precautions, NPO status , Patient's Chart, lab work & pertinent test results  History of Anesthesia Complications Negative for: history of anesthetic complications  Airway Mallampati: II  TM Distance: >3 FB Neck ROM: Full    Dental no notable dental hx. (+) Dental Advisory Given   Pulmonary former smoker,  breath sounds clear to auscultation  Pulmonary exam normal       Cardiovascular negative cardio ROS Normal cardiovascular examRhythm:Regular Rate:Normal     Neuro/Psych negative neurological ROS  negative psych ROS   GI/Hepatic negative GI ROS, Neg liver ROS,   Endo/Other  negative endocrine ROS  Renal/GU negative Renal ROS  negative genitourinary   Musculoskeletal negative musculoskeletal ROS (+)   Abdominal   Peds negative pediatric ROS (+)  Hematology negative hematology ROS (+)   Anesthesia Other Findings   Reproductive/Obstetrics negative OB ROS                             Anesthesia Physical Anesthesia Plan  ASA: II  Anesthesia Plan: Epidural   Post-op Pain Management:    Induction:   Airway Management Planned:   Additional Equipment:   Intra-op Plan:   Post-operative Plan:   Informed Consent: I have reviewed the patients History and Physical, chart, labs and discussed the procedure including the risks, benefits and alternatives for the proposed anesthesia with the patient or authorized representative who has indicated his/her understanding and acceptance.   Dental advisory given  Plan Discussed with: CRNA  Anesthesia Plan Comments:         Anesthesia Quick Evaluation

## 2015-02-01 NOTE — Transfer of Care (Signed)
Immediate Anesthesia Transfer of Care Note  Patient: Shelley Williams  Procedure(s) Performed: Procedure(s): POST PARTUM TUBAL LIGATION (Bilateral)  Patient Location: PACU  Anesthesia Type:Epidural  Level of Consciousness: awake, alert  and oriented  Airway & Oxygen Therapy: Patient Spontanous Breathing  Post-op Assessment: Report given to RN and Post -op Vital signs reviewed and stable  Post vital signs: Reviewed and stable  Last Vitals:  Filed Vitals:   02/01/15 0945  BP: 121/80  Pulse: 73  Temp:   Resp: 18    Complications: No apparent anesthesia complications

## 2015-02-01 NOTE — Anesthesia Preprocedure Evaluation (Signed)
Anesthesia Evaluation  Patient identified by MRN, date of birth, ID band Patient awake    Reviewed: Allergy & Precautions, NPO status , Patient's Chart, lab work & pertinent test results  History of Anesthesia Complications Negative for: history of anesthetic complications  Airway Mallampati: II  TM Distance: >3 FB Neck ROM: Full    Dental  (+) Teeth Intact   Pulmonary neg shortness of breath, neg sleep apnea, neg COPDneg recent URI, former smoker,  breath sounds clear to auscultation        Cardiovascular negative cardio ROS  Rhythm:Regular     Neuro/Psych negative neurological ROS  negative psych ROS   GI/Hepatic negative GI ROS, Neg liver ROS,   Endo/Other  negative endocrine ROS  Renal/GU negative Renal ROS     Musculoskeletal   Abdominal   Peds  Hematology  (+) anemia , Thrombocytopenia    Anesthesia Other Findings   Reproductive/Obstetrics                             Anesthesia Physical Anesthesia Plan  ASA: II  Anesthesia Plan: Epidural   Post-op Pain Management:    Induction:   Airway Management Planned: Nasal Cannula  Additional Equipment: None  Intra-op Plan:   Post-operative Plan:   Informed Consent: I have reviewed the patients History and Physical, chart, labs and discussed the procedure including the risks, benefits and alternatives for the proposed anesthesia with the patient or authorized representative who has indicated his/her understanding and acceptance.   Dental advisory given  Plan Discussed with: CRNA and Surgeon  Anesthesia Plan Comments:         Anesthesia Quick Evaluation

## 2015-02-01 NOTE — MAU Note (Signed)
Report to Hinton Dyer, [facility] charge RN.  Patient to be admitted to room 163.

## 2015-02-01 NOTE — Anesthesia Postprocedure Evaluation (Signed)
  Anesthesia Post-op Note  Patient: Shelley Williams  Procedure(s) Performed: Procedure(s): POST PARTUM TUBAL LIGATION (Bilateral)  Patient Location: PACU  Anesthesia Type:Epidural  Level of Consciousness: awake  Airway and Oxygen Therapy: Patient Spontanous Breathing  Post-op Pain: none  Post-op Assessment: Post-op Vital signs reviewed, Patient's Cardiovascular Status Stable, Respiratory Function Stable, Patent Airway, No signs of Nausea or vomiting and Pain level controlled  Post-op Vital Signs: Reviewed and stable  Last Vitals:  Filed Vitals:   02/01/15 1517  BP: 110/69  Pulse: 93  Temp: 36.9 C  Resp: 18    Complications: No apparent anesthesia complications

## 2015-02-01 NOTE — H&P (Signed)
Shelley Williams is a 39 y.o. female presenting for labor  72 you G3P1011 @ 39+3 presents for painful contractions and was found to be in labor.  History OB History    Gravida Para Term Preterm AB TAB SAB Ectopic Multiple Living   3 1 1  0 1 1 0 0 0 1     Past Medical History  Diagnosis Date  . HSV (herpes simplex virus) infection   . Anemia    Past Surgical History  Procedure Laterality Date  . Wisdom tooth extraction    . No past surgeries     Family History: family history includes Asthma in her mother; Hypertension in her father and mother. Social History:  reports that she has quit smoking. Her smoking use included Cigarettes. She has never used smokeless tobacco. She reports that she does not drink alcohol or use illicit drugs.   Prenatal Transfer Tool  Maternal Diabetes: No Genetic Screening: Abnormal:  Results: Elevated risk of Trisomy 21, Other: NIPT T13/18/21 <1:10000 Maternal Ultrasounds/Referrals: Normal Fetal Ultrasounds or other Referrals:  None Maternal Substance Abuse:  No Significant Maternal Medications:  Meds include: Other: Valtrex Significant Maternal Lab Results:  None Other Comments:  None  ROS: as above  Dilation: 10 Effacement (%): 100 Station: +1 Exam by:: E Siska RN, S Nix RN Blood pressure 117/78, pulse 81, temperature 97.9 F (36.6 C), temperature source Oral, resp. rate 18, height 5\' 3"  (1.6 m), weight 66.134 kg (145 lb 12.8 oz), SpO2 97 %, unknown if currently breastfeeding. Exam Physical Exam  Prenatal labs: ABO, Rh: --/--/A POS, A POS (05/29 0140) Antibody: NEG (05/29 0140) Rubella: Immune (11/20 0000) RPR: Nonreactive (11/20 0000)  HBsAg: Negative (11/20 0000)  HIV: Non-reactive (11/20 0000)  GBS: Positive (05/06 0000)   Assessment/Plan: 1) admit 2) Epidural on request 3) PCN for GBS   Jaylia Pettus H. 02/01/2015, 8:14 AM

## 2015-02-01 NOTE — Anesthesia Procedure Notes (Signed)
Epidural Patient location during procedure: OB  Staffing Anesthesiologist: Spiros Greenfeld Performed by: anesthesiologist   Preanesthetic Checklist Completed: patient identified, site marked, surgical consent, pre-op evaluation, timeout performed, IV checked, risks and benefits discussed and monitors and equipment checked  Epidural Patient position: sitting Prep: site prepped and draped and DuraPrep Patient monitoring: continuous pulse ox and blood pressure Approach: midline Location: L3-L4 Injection technique: LOR saline  Needle:  Needle type: Tuohy  Needle gauge: 17 G Needle length: 9 cm and 9 Needle insertion depth: 7 cm Catheter type: closed end flexible Catheter size: 19 Gauge Catheter at skin depth: 12 cm Test dose: negative  Assessment Events: blood not aspirated, injection not painful, no injection resistance, negative IV test and no paresthesia  Additional Notes Patient identified. Risks/Benefits/Options discussed with patient including but not limited to bleeding, infection, nerve damage, paralysis, failed block, incomplete pain control, headache, blood pressure changes, nausea, vomiting, reactions to medication both or allergic, itching and postpartum back pain. Confirmed with bedside nurse the patient's most recent platelet count. Confirmed with patient that they are not currently taking any anticoagulation, have any bleeding history or any family history of bleeding disorders. Patient expressed understanding and wished to proceed. All questions were answered. Sterile technique was used throughout the entire procedure. Please see nursing notes for vital signs. Test dose was given through epidural catheter and negative prior to continuing to dose epidural or start infusion. Warning signs of high block given to the patient including shortness of breath, tingling/numbness in hands, complete motor block, or any concerning symptoms with instructions to call for help. Patient was  given instructions on fall risk and not to get out of bed. All questions and concerns addressed with instructions to call with any issues or inadequate analgesia.

## 2015-02-02 ENCOUNTER — Encounter (HOSPITAL_COMMUNITY): Payer: Self-pay | Admitting: Obstetrics and Gynecology

## 2015-02-02 LAB — CBC
HEMATOCRIT: 27.5 % — AB (ref 36.0–46.0)
HEMOGLOBIN: 9.5 g/dL — AB (ref 12.0–15.0)
MCH: 34.2 pg — ABNORMAL HIGH (ref 26.0–34.0)
MCHC: 34.5 g/dL (ref 30.0–36.0)
MCV: 98.9 fL (ref 78.0–100.0)
PLATELETS: 123 10*3/uL — AB (ref 150–400)
RBC: 2.78 MIL/uL — ABNORMAL LOW (ref 3.87–5.11)
RDW: 14.5 % (ref 11.5–15.5)
WBC: 11.2 10*3/uL — ABNORMAL HIGH (ref 4.0–10.5)

## 2015-02-02 MED ORDER — DOCUSATE SODIUM 100 MG PO CAPS: 100.0000 mg | ORAL_CAPSULE | Freq: Two times a day (BID) | ORAL | Status: DC

## 2015-02-02 MED ORDER — IBUPROFEN 600 MG PO TABS: 600.0000 mg | ORAL_TABLET | Freq: Four times a day (QID) | ORAL | Status: DC | PRN

## 2015-02-02 MED ORDER — OXYCODONE-ACETAMINOPHEN 5-325 MG PO TABS: 1.0000 | ORAL_TABLET | Freq: Four times a day (QID) | ORAL | Status: DC | PRN

## 2015-02-02 NOTE — Anesthesia Postprocedure Evaluation (Signed)
Anesthesia Post Note  Patient: Shelley Williams  Procedure(s) Performed: * No procedures listed *  Anesthesia type: Epidural  Patient location: Mother/Baby  Post pain: Pain level controlled  Post assessment: Post-op Vital signs reviewed  Last Vitals:  Filed Vitals:   02/02/15 0817  BP: 100/62  Pulse: 62  Temp: 36.3 C  Resp: 18    Post vital signs: Reviewed  Level of consciousness:alert  Complications: No apparent anesthesia complications

## 2015-02-02 NOTE — Addendum Note (Signed)
Addendum  created 02/02/15 0849 by Flossie Dibble, CRNA   Modules edited: Notes Section   Notes Section:  File: [id number]

## 2015-02-02 NOTE — Progress Notes (Signed)
Patient is doing well.  She is ambulating, voiding, tolerating PO.  Pain control is good.  Lochia is appropriate  Filed Vitals:   02/01/15 2200 02/02/15 0200 02/02/15 0537 02/02/15 0817  BP: 106/61 107/75 96/53 100/62  Pulse: 77 68 66 62  Temp: 98.1 F (36.7 C) 98.1 F (36.7 C) 98 F (36.7 C) 97.3 F (36.3 C)  TempSrc: Oral Oral Oral Oral  Resp: 20 20 18 18   Height:      Weight:      SpO2:   99%     NAD Fundus firm Infraumbilical incision is c/d/i, no erythema or drainage.  Appropriate TTP Ext: no edema  Lab Results  Component Value Date   WBC 11.2* 02/02/2015   HGB 9.5* 02/02/2015   HCT 27.5* 02/02/2015   MCV 98.9 02/02/2015   PLT 123* 02/02/2015    --/--/A POS, A POS (05/29 0140)/RImmune  A/P 39 y.o. [id number] PPD#1 s/p TSVD, POD#1 s/p PP BTL. Routine care.   Meeting all goals.  Desires d/c to home Plan for circ  In office.    [place]

## 2015-02-02 NOTE — Discharge Instructions (Signed)

## 2015-02-02 NOTE — Anesthesia Postprocedure Evaluation (Signed)
Anesthesia Post Note  Patient: Shelley Williams  Procedure(s) Performed: Procedure(s) (LRB): POST PARTUM TUBAL LIGATION (Bilateral)  Anesthesia type: Epidural  Patient location: Mother/Baby  Post pain: Pain level controlled  Post assessment: Post-op Vital signs reviewed  Last Vitals:  Filed Vitals:   02/02/15 0817  BP: 100/62  Pulse: 62  Temp: 36.3 C  Resp: 18    Post vital signs: Reviewed  Level of consciousness:alert  Complications: No apparent anesthesia complications

## 2015-02-02 NOTE — Discharge Summary (Signed)
Obstetric Discharge Summary Reason for Admission: onset of labor Prenatal Procedures: none Intrapartum Procedures: spontaneous vaginal delivery and tubal ligation Postpartum Procedures: none Complications-Operative and Postpartum: none HEMOGLOBIN  Date Value Ref Range Status  02/02/2015 9.5* 12.0 - 15.0 g/dL Final   HCT  Date Value Ref Range Status  02/02/2015 27.5* 36.0 - 46.0 % Final    Physical Exam:  General: alert and cooperative Lochia: appropriate Uterine Fundus: firm Incision: healing well, no significant drainage DVT Evaluation: No evidence of DVT seen on physical exam.  Discharge Diagnoses: Term Pregnancy-delivered  Discharge Information: Date: 02/02/2015 Activity: pelvic rest Diet: routine Medications: PNV, Ibuprofen, Colace and Percocet Condition: stable Instructions: refer to practice specific booklet Discharge to: home Follow-up Information    Follow up with Marcial Pacas., MD In 4 weeks.   Specialty:  Obstetrics and Gynecology   Contact information:   [place] [id number] [phone]       Newborn Data: Live born female  Birth Weight: 7 lb 6.7 oz (3365 g) APGAR: ,   Home with mother.  [place] 02/02/2015, 8:27 AM

## 2015-02-09 ENCOUNTER — Encounter (HOSPITAL_COMMUNITY): Payer: Self-pay | Admitting: *Deleted

## 2015-03-05 ENCOUNTER — Other Ambulatory Visit: Payer: Self-pay | Admitting: Obstetrics and Gynecology

## 2015-03-10 LAB — CYTOLOGY - PAP

## 2015-05-06 ENCOUNTER — Encounter (HOSPITAL_COMMUNITY): Payer: Self-pay | Admitting: Emergency Medicine

## 2015-05-06 ENCOUNTER — Emergency Department (INDEPENDENT_AMBULATORY_CARE_PROVIDER_SITE_OTHER)
Admission: EM | Admit: 2015-05-06 | Discharge: 2015-05-06 | Disposition: A | Discharge status: Home / Self Care | Payer: Managed Care, Other (non HMO) | Source: Home / Self Care | Attending: Family Medicine | Admitting: Family Medicine

## 2015-05-06 ENCOUNTER — Emergency Department (INDEPENDENT_AMBULATORY_CARE_PROVIDER_SITE_OTHER): Payer: Managed Care, Other (non HMO)

## 2015-05-06 DIAGNOSIS — S92911A Unspecified fracture of right toe(s), initial encounter for closed fracture: Secondary | ICD-10-CM | POA: Diagnosis not present

## 2015-05-06 MED ORDER — DICLOFENAC SODIUM 75 MG PO TBEC: 75.0000 mg | DELAYED_RELEASE_TABLET | Freq: Two times a day (BID) | ORAL | Status: DC
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
## 2015-05-06 NOTE — Discharge Instructions (Signed)
You procure fourth toe. Please follow-up with [facility]. Please call the morning for follow-up appointment. Please wear the boot as instructed. Please use the Voltaren for pain and swelling relief.
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
## 2016-06-14 LAB — HM PAP SMEAR

## 2017-12-23 ENCOUNTER — Other Ambulatory Visit: Payer: Self-pay

## 2017-12-23 ENCOUNTER — Emergency Department (HOSPITAL_COMMUNITY)
Admission: EM | Admit: 2017-12-23 | Discharge: 2017-12-23 | Disposition: A | Discharge status: Home / Self Care | Payer: Managed Care, Other (non HMO) | Attending: Physician Assistant | Admitting: Physician Assistant

## 2017-12-23 ENCOUNTER — Encounter (HOSPITAL_COMMUNITY): Payer: Self-pay | Admitting: Emergency Medicine

## 2017-12-23 DIAGNOSIS — Z87891 Personal history of nicotine dependence: Secondary | ICD-10-CM | POA: Diagnosis not present

## 2017-12-23 DIAGNOSIS — Z79899 Other long term (current) drug therapy: Secondary | ICD-10-CM | POA: Insufficient documentation

## 2017-12-23 DIAGNOSIS — K0889 Other specified disorders of teeth and supporting structures: Secondary | ICD-10-CM | POA: Diagnosis present

## 2017-12-23 MED ORDER — NAPROXEN 500 MG PO TABS: 500.0000 mg | ORAL_TABLET | Freq: Two times a day (BID) | ORAL | 0 refills | Status: AC

## 2017-12-23 MED ORDER — BUPIVACAINE HCL 0.25 % IJ SOLN
20.0000 mL | Freq: Once | INTRAMUSCULAR | Status: AC
  Administered 2017-12-23: 20 mL
  Filled 2017-12-23: qty 20

## 2017-12-23 MED ORDER — PENICILLIN V POTASSIUM 500 MG PO TABS: 500.0000 mg | ORAL_TABLET | Freq: Four times a day (QID) | ORAL | 0 refills | Status: AC

## 2017-12-23 NOTE — ED Notes (Signed)
Declined W/C at D/C and was escorted to lobby by RN. 

## 2017-12-23 NOTE — ED Provider Notes (Signed)
[facility] EMERGENCY DEPARTMENT Provider Note   CSN: [id number] Arrival date & time: 12/23/17  1429     History   Chief Complaint Chief Complaint  Patient presents with  . Dental Pain    HPI Shelley Williams is a 42 y.o. female who presents for evaluation of dental pain.  Patient reports that she has had left-sided dental pain for the last week.  Patient believes that she lost a crown on her back molar.  Patient reports that she went to the dentist and states that she was evaluated.  She states that in order to get it fixed, she needs to be cleared by next year and has not been done yet.  Patient reports that she has been taking intermittent ibuprofen with minimal improvement in pain.  Patient comes the ED today because she is continued to have pain.  Patient is requesting a dental block.  Patient reports she is able to eat and drink with out difficulty but does report worsening pain.  No difficulty causing her secretions.  Patient denies any fevers, neck or facial swelling, drooling.  The history is provided by the patient.    Past Medical History:  Diagnosis Date  . Anemia   . HSV (herpes simplex virus) infection     Patient Active Problem List   Diagnosis Date Noted  . Active labor 02/01/2015  . Spontaneous vaginal delivery 02/01/2015  . Normal vaginal delivery 06/04/2011    Past Surgical History:  Procedure Laterality Date  . NO PAST SURGERIES    . TUBAL LIGATION Bilateral 02/01/2015   Procedure: POST PARTUM TUBAL LIGATION;  Surgeon: [name], MD;  Location: [facility];  Service: Gynecology;  Laterality: Bilateral;  . WISDOM TOOTH EXTRACTION       OB History    Gravida  3   Para  2   Term  2   Preterm  0   AB  1   Living  2     SAB  0   TAB  1   Ectopic  0   Multiple  0   Live Births  2            Home Medications    Prior to Admission medications   Medication Sig Start Date End Date Taking? Authorizing Provider  Calcium  Carbonate Antacid (TUMS ULTRA 1000 PO) Take 1-2 tablets by mouth at bedtime as needed. For heartburn     [provider]  diclofenac (VOLTAREN) 75 MG EC tablet Take 1 tablet (75 mg total) by mouth 2 (two) times daily. 05/06/15   Waldemar Dickens, MD  docusate sodium (COLACE) 100 MG capsule Take 1 capsule (100 mg total) by mouth 2 (two) times daily. 02/02/15   Jerelyn Charles, MD  ibuprofen (ADVIL,MOTRIN) 600 MG tablet Take 1 tablet (600 mg total) by mouth every 6 (six) hours as needed for moderate pain or cramping. 02/02/15   Jerelyn Charles, MD  Iron-FA-B Cmp-C-Biot-Probiotic (FUSION PLUS PO) Take 1 capsule by mouth.    [provider]  naproxen (NAPROSYN) 500 MG tablet Take 1 tablet (500 mg total) by mouth 2 (two) times daily for 7 days. 12/23/17 12/30/17  Volanda Napoleon, PA-C  oxyCODONE-acetaminophen (PERCOCET/ROXICET) 5-325 MG per tablet Take 1 tablet by mouth every 6 (six) hours as needed for severe pain. 02/02/15   Jerelyn Charles, MD  penicillin v potassium (VEETID) 500 MG tablet Take 1 tablet (500 mg total) by mouth 4 (four) times daily for 7 days.  12/23/17 12/30/17  Volanda Napoleon, PA-C  prenatal vitamin w/FE, FA (PRENATAL 1 + 1) 27-1 MG TABS Take 1 tablet by mouth daily.      [provider]    Family History Family History  Problem Relation Age of Onset  . Asthma Mother   . Hypertension Mother   . Hypertension Father     Social History Social History   Tobacco Use  . Smoking status: Former Smoker    Packs/day: 0.25    Types: Cigarettes  . Smokeless tobacco: Never Used  Substance Use Topics  . Alcohol use: No  . Drug use: No     Allergies   Patient has no known allergies.   Review of Systems Review of Systems  Constitutional: Negative for fever.  HENT: Positive for dental problem. Negative for drooling, facial swelling and trouble swallowing.   Gastrointestinal: Negative for vomiting.     Physical Exam Updated Vital Signs BP 99/73 (BP  Location: Right Arm)   Pulse 97   Temp 97.9 F (36.6 C) (Oral)   SpO2 100%   Physical Exam  Constitutional: She appears well-developed and well-nourished.  HENT:  Head: Normocephalic and atraumatic.  Mouth/Throat: Dental caries present. No dental abscesses.    Airway is patent, Phonation is intact.  No identifiable dental abscess noted.  No facial swelling.  Uvula is midline.  No trismus.  Eyes: Conjunctivae and EOM are normal. Right eye exhibits no discharge. Left eye exhibits no discharge. No scleral icterus.  Pulmonary/Chest: Effort normal.  Neurological: She is alert.  Skin: Skin is warm and dry.  Psychiatric: She has a normal mood and affect. Her speech is normal and behavior is normal.  Nursing note and vitals reviewed.    ED Treatments / Results  Labs (all labs ordered are listed, but only abnormal results are displayed) Labs Reviewed - No data to display  EKG None  Radiology No results found.  Procedures Dental Block Date/Time: 12/23/2017 5:41 PM Performed by: Volanda Napoleon, PA-C Authorized by: Volanda Napoleon, PA-C   Consent:    Consent obtained:  Verbal   Consent given by:  Patient   Risks discussed:  Nerve damage, infection, pain and unsuccessful block Indications:    Indications: dental pain   Location:    Block type:  Supraperiosteal   Supraperiosteal location:  Lower teeth Procedure details (see MAR for exact dosages):    Needle gauge:  25 G   Anesthetic injected:  Bupivacaine 0.25% w/o epi   Injection procedure:  Anatomic landmarks identified, introduced needle, incremental injection and negative aspiration for blood Post-procedure details:    Outcome:  Pain relieved   Patient tolerance of procedure:  Tolerated well, no immediate complications   (including critical care time)  Medications Ordered in ED Medications  bupivacaine (MARCAINE) 0.25 % (with pres) injection 20 mL (20 mLs Infiltration Given 12/23/17 1732)     Initial  Impression / Assessment and Plan / ED Course  I have reviewed the triage vital signs and the nursing notes.  Pertinent labs & imaging results that were available during my care of the patient were reviewed by me and considered in my medical decision making (see chart for details).     41 y.o. female who presents for evaluation of dental pain.  States that is been ongoing for last week.  Was seen by dentist and states that she needed insurance clearance for her to get fixed.  Comes today for worsening pain.  No facial swelling,  difficulty tolerating secretions, fever.  Patient reports taking ibuprofen with minimal improvement.  Comes today requesting a dental block. Patient is afebrile, non-toxic appearing, sitting comfortably on examination table. Vital signs reviewed and stable.  Exam shows partially cracked molar on the left lower side.  No surrounding gingival erythema, fluctuance.  No identifiable dental abscess.  Uvula is midline.  No trismus.  History/physical exam is not concerning for Ludwig angina or peritonsillar abscess.  Discussed risk first benefits of dental block with patient.  Patient understands risk first benefits and wishes to proceed at this time.  Dental block as documented above.  Patient reports improvement in pain after procedure.  We will plan to send patient home with prophylactic antibiotics, Naprosyn.  Also plan to give outpatient referral to dental resource guide for further evaluation.  Instructed patient to follow-up with her dentist as directed.  Patient is tolerating p.o. without any difficulty in the ED.  Vital signs stable.  Patient had ample opportunity for questions and discussion. All patient's questions were answered with full understanding. Strict return precautions discussed. Patient expresses understanding and agreement to plan.   Final Clinical Impressions(s) / ED Diagnoses   Final diagnoses:  Pain, dental    ED Discharge Orders        Ordered    naproxen  (NAPROSYN) 500 MG tablet  2 times daily     12/23/17 1727    penicillin v potassium (VEETID) 500 MG tablet  4 times daily     12/23/17 1727       Volanda Napoleon, PA-C 12/23/17 1744    Macarthur Critchley, MD 12/23/17 2344

## 2017-12-23 NOTE — Discharge Instructions (Signed)
Take antibiotics as directed. Please take all of your antibiotics until finished.  Take Naprosyn.   The exam and treatment you received today has been provided on an emergency basis only. This is not a substitute for complete medical or dental care. If your problem worsens or new symptoms (problems) appear, and you are unable to arrange prompt follow-up care with your dentist, call or return to this location. If you do not have a dentist, please follow-up with one on the list provided  CALL YOUR DENTIST OR RETURN IMMEDIATELY IF you develop a fever, rash, difficulty breathing or swallowing, neck or facial swelling, or other potentially serious concerns.   Please follow-up with one of the dental clinics provided to you below or in your paperwork. Call and tell them you were seen in the Emergency Dept and arrange for an appointment. You may have to call multiple places in order to find a place to be seen.  Dental Assistance If the dentist on-call cannot see you, please use the resources below:   Patients with Medicaid: [place] [facility] [place], [place] [address], [phone]  If unable to pay, or uninsured, contact [facility] [phone]) or [facility] [phone] in [place], [place] in [facility]) to become qualified for the adult dental clinic  Other [place]- [place], [place], [place], [address]    [phone], Ext. [phone]    2nd and 4th Thursday of the month at 6:30am    10 clients each day by appointment, can sometimes see walk-in     patients if someone does not show for an appointment [place], [place], [place], [address]    [phone] [facility]- [address], [place], [place], [address]    [phone]  [place] Department- [phone] [place] [facility]  Department- [phone]

## 2017-12-23 NOTE — ED Triage Notes (Signed)
Patient presents to ED for assessment of left lower dental pain after losing a crown.  Pt told by dentist she needs to be cleared by her insurance before they are able to do a procedure.  Patient c/o referred pain to left upper gums as well.  Patient denies fevers and chills at home.  Patient asking for pain control "a numbing shot"

## 2018-01-01 ENCOUNTER — Other Ambulatory Visit (HOSPITAL_COMMUNITY)
Admission: RE | Admit: 2018-01-01 | Discharge: 2018-01-01 | Disposition: A | Discharge status: Home / Self Care | Payer: Managed Care, Other (non HMO) | Source: Ambulatory Visit | Attending: Family Medicine | Admitting: Family Medicine

## 2018-01-01 ENCOUNTER — Encounter: Payer: Self-pay | Admitting: Family Medicine

## 2018-01-01 ENCOUNTER — Ambulatory Visit (INDEPENDENT_AMBULATORY_CARE_PROVIDER_SITE_OTHER): Payer: Managed Care, Other (non HMO) | Admitting: Family Medicine

## 2018-01-01 ENCOUNTER — Other Ambulatory Visit: Payer: Self-pay

## 2018-01-01 VITALS — BP 106/70 | HR 72 | Temp 97.8°F | Ht 64.5 in | Wt 110.4 lb

## 2018-01-01 DIAGNOSIS — B009 Herpesviral infection, unspecified: Secondary | ICD-10-CM | POA: Insufficient documentation

## 2018-01-01 DIAGNOSIS — O99345 Other mental disorders complicating the puerperium: Secondary | ICD-10-CM

## 2018-01-01 DIAGNOSIS — O99119 Other diseases of the blood and blood-forming organs and certain disorders involving the immune mechanism complicating pregnancy, unspecified trimester: Secondary | ICD-10-CM

## 2018-01-01 DIAGNOSIS — Z124 Encounter for screening for malignant neoplasm of cervix: Secondary | ICD-10-CM | POA: Insufficient documentation

## 2018-01-01 DIAGNOSIS — Z0000 Encounter for general adult medical examination without abnormal findings: Secondary | ICD-10-CM | POA: Diagnosis not present

## 2018-01-01 DIAGNOSIS — F53 Postpartum depression: Secondary | ICD-10-CM

## 2018-01-01 DIAGNOSIS — F17290 Nicotine dependence, other tobacco product, uncomplicated: Secondary | ICD-10-CM | POA: Insufficient documentation

## 2018-01-01 DIAGNOSIS — F17210 Nicotine dependence, cigarettes, uncomplicated: Secondary | ICD-10-CM | POA: Diagnosis not present

## 2018-01-01 DIAGNOSIS — D696 Thrombocytopenia, unspecified: Secondary | ICD-10-CM

## 2018-01-01 DIAGNOSIS — Z87891 Personal history of nicotine dependence: Secondary | ICD-10-CM | POA: Insufficient documentation

## 2018-01-01 DIAGNOSIS — L299 Pruritus, unspecified: Secondary | ICD-10-CM

## 2018-01-01 HISTORY — DX: Postpartum depression: F53.0

## 2018-01-01 HISTORY — DX: Other diseases of the blood and blood-forming organs and certain disorders involving the immune mechanism complicating pregnancy, unspecified trimester: D69.6

## 2018-01-01 LAB — LIPID PANEL
CHOL/HDL RATIO: 3
Cholesterol: 201 mg/dL — ABNORMAL HIGH (ref 0–200)
HDL: 76.5 mg/dL (ref >39.00)
LDL Cholesterol: 112 mg/dL — ABNORMAL HIGH (ref 0–99)
NONHDL: 124.47
Triglycerides: 63 mg/dL (ref 0.0–149.0)
VLDL: 12.6 mg/dL (ref 0.0–40.0)

## 2018-01-01 LAB — CBC WITH DIFFERENTIAL/PLATELET
BASOS ABS: 0 10*3/uL (ref 0.0–0.1)
Basophils Relative: 0.9 % (ref 0.0–3.0)
Eosinophils Absolute: 0.1 10*3/uL (ref 0.0–0.7)
Eosinophils Relative: 1.8 % (ref 0.0–5.0)
HEMATOCRIT: 39.2 % (ref 36.0–46.0)
Hemoglobin: 13.1 g/dL (ref 12.0–15.0)
LYMPHS ABS: 1.8 10*3/uL (ref 0.7–4.0)
LYMPHS PCT: 31.6 % (ref 12.0–46.0)
MCHC: 33.4 g/dL (ref 30.0–36.0)
MCV: 101.8 fl — AB (ref 78.0–100.0)
MONOS PCT: 8.3 % (ref 3.0–12.0)
Monocytes Absolute: 0.5 10*3/uL (ref 0.1–1.0)
NEUTROS ABS: 3.3 10*3/uL (ref 1.4–7.7)
NEUTROS PCT: 57.4 % (ref 43.0–77.0)
Platelets: 270 10*3/uL (ref 150.0–400.0)
RBC: 3.85 Mil/uL — AB (ref 3.87–5.11)
RDW: 13.6 % (ref 11.5–15.5)
WBC: 5.8 10*3/uL (ref 4.0–10.5)

## 2018-01-01 LAB — COMPREHENSIVE METABOLIC PANEL
ALK PHOS: 44 U/L (ref 39–117)
ALT: 12 U/L (ref 0–35)
AST: 15 U/L (ref 0–37)
Albumin: 4.3 g/dL (ref 3.5–5.2)
BUN: 15 mg/dL (ref 6–23)
CALCIUM: 9.7 mg/dL (ref 8.4–10.5)
CO2: 25 meq/L (ref 19–32)
Chloride: 106 mEq/L (ref 96–112)
Creatinine, Ser: 0.61 mg/dL (ref 0.40–1.20)
GFR: 138.67 mL/min (ref >60.00)
GLUCOSE: 81 mg/dL (ref 70–99)
POTASSIUM: 4.4 meq/L (ref 3.5–5.1)
Sodium: 139 mEq/L (ref 135–145)
Total Bilirubin: 0.3 mg/dL (ref 0.2–1.2)
Total Protein: 7.5 g/dL (ref 6.0–8.3)

## 2018-01-01 NOTE — Progress Notes (Signed)
Subjective  CC:  Chief Complaint  Patient presents with  . Establish Care    Transfer from [facility], last seen in October of 2017  . Rash    Rash on Chest and Around Both Breast     HPI: Shelley Williams is a 42 y.o. female is a former [facility] patient and is here to reestablish care with me today. Last visit with me 06/2016 for CPE.   She has the following concerns or needs:  Persistent itching; c/o itching neck and anterior chest. Has hypopigmented scars from scratching. No rash. We had discussed back in 2017; oral antihistamine and skin care recommended w/o relief. Has stress: ?neurodermatitis but pt would like input from derm; this is reasonable.   S/p dental infection and crown today. I reviewed ER notes from recent visit.   HM: had nl pap with HR HPV in 2017; was to have repeat in 2018 but pt hasn't yet. Due for cpe. imms up to date.   Single, not in a relationship. Takes care of 19 yo daughter and 74 yo son. Works full time. Smoking again but would like to quit. Quit twice in past cold [place]. Mostly smoking is a stress reliever.   We updated and reviewed the patient's past history in detail and it is documented below.  Patient Active Problem List   Diagnosis Date Noted  . Herpes simplex type 2 infection 01/01/2018    Overview:  Rare outbreak   . Nicotine dependence, cigarettes, uncomplicated 78/24/2353  . Pruritus 01/01/2018   Health Maintenance  Topic Date Due  . PAP SMEAR  06/14/2017  . INFLUENZA VACCINE  04/05/2018  . TETANUS/TDAP  11/17/2024  . HIV Screening  Completed   Immunization History  Administered Date(s) Administered  . Hepatitis B, ped/adol 05/30/2014  . Influenza, Seasonal, Injecte, Preservative Fre 06/05/2016  . Influenza,inj,quad, With Preservative 06/05/2014  . Tdap 09/05/2010, 06/03/2011, 11/18/2014   Current Meds  Medication Sig  . amoxicillin (AMOXIL) 875 MG tablet amoxicillin 875 mg tablet  . ibuprofen (ADVIL,MOTRIN) 600 MG tablet Take 1 tablet  (600 mg total) by mouth every 6 (six) hours as needed for moderate pain or cramping.  . naproxen sodium (ANAPROX) 275 MG tablet     Allergies: Patient has No Known Allergies. Past Medical History Patient  has a past medical history of Benign gestational thrombocytopenia ([place]) (01/01/2018), HSV (herpes simplex virus) infection, and Postpartum depression (01/01/2018). Past Surgical History Patient  has a past surgical history that includes Wisdom tooth extraction and Tubal ligation (Bilateral, 02/01/2015). Family History: Patient family history includes Arthritis in her mother; Asthma in her mother; GER disease in her father; Glaucoma in her father; Healthy in her daughter and son; Heart disease in her maternal grandmother; Hyperlipidemia in her father; Hypertension in her father and mother; Lung cancer in her paternal grandfather; Pancreatic cancer in her father. Social History:  Patient  reports that she has been smoking cigarettes.  She has been smoking about 0.50 packs per day. She has never used smokeless tobacco. She reports that she does not drink alcohol or use drugs.  Review of Systems: Constitutional: negative for fever or malaise Ophthalmic: negative for photophobia, double vision or loss of vision Cardiovascular: negative for chest pain, dyspnea on exertion, or new LE swelling Respiratory: negative for SOB or persistent cough Gastrointestinal: negative for abdominal pain, change in bowel habits or melena Genitourinary: negative for dysuria or gross hematuria Musculoskeletal: negative for new gait disturbance or muscular weakness Integumentary: negative for new or  persistent rashes Neurological: negative for TIA or stroke symptoms Psychiatric: negative for SI or delusions Allergic/Immunologic: negative for hives  Patient Care Team    Relationship Specialty Notifications Start End  Leamon Arnt, MD PCP - General Family Medicine  01/01/18     Objective  Vitals: BP 106/70    Pulse 72   Temp 97.8 F (36.6 C)   Ht 5' 4.5" (1.638 m)   Wt 110 lb 6.4 oz (50.1 kg)   LMP 12/25/2016   BMI 18.66 kg/m  General:  Well developed, well nourished but thin, no acute distress  Psych:  Alert and oriented,normal mood and affect HEENT:  Normocephalic, atraumatic, non-icteric sclera, PERRL, oropharynx is without mass or exudate, supple neck without adenopathy, mass or thyromegaly Cardiovascular:  RRR without gallop, rub or murmur, nondisplaced PMI Respiratory:  Good breath sounds bilaterally, CTAB with normal respiratory effort Gastrointestinal: normal bowel sounds, soft, non-tender, no noted masses. No HSM MSK: no deformities, contusions. Joints are without erythema or swelling Skin:  Warm, papules on chin and back; hypopigmented scars on chest Neurologic:    Mental status is normal. Gross motor and sensory exams are normal. Normal gait Breast exam: nl bilaterally without masses or adenopathy GYN: normal multiparous cervix; thin homogenous discharge, nl fundus and adnexa; pap done.   Assessment  1. Annual physical exam   2. Pruritus   3. Cervical cancer screening   4. Nicotine dependence, cigarettes, uncomplicated      Plan   Female Wellness Visit:  Age appropriate Health Maintenance and Prevention measures were discussed with patient. Included topics are cancer screening recommendations, ways to keep healthy (see AVS) including dietary and exercise recommendations, regular eye and dental care, use of seat belts, and avoidance of moderate alcohol use and tobacco use. Pap smear repeated today; to gyn if abnl.  BMI: discussed patient's BMI and encouraged positive lifestyle modifications to help get to or maintain a target BMI.  HM needs and immunizations were addressed and ordered. See below for orders. See HM and immunization section for updates.  Routine labs and screening tests ordered including cmp, cbc and lipids where appropriate.  Discussed recommendations  regarding Vit D and calcium supplementation (see AVS)  Smoking cessation counseling done. Nicotine patches 14mg  x 4 weeks, then 7mg  x 4 weeks. Discussed quit plan. See avs. F/u if needs more help     Refer to derm for pruiritis  Follow up:  Return in about 1 year (around 01/02/2019) for complete physical.  Commons side effects, risks, benefits, and alternatives for medications and treatment plan prescribed today were discussed, and the patient expressed understanding of the given instructions. Patient is instructed to call or message via MyChart if he/she has any questions or concerns regarding our treatment plan. No barriers to understanding were identified. We discussed Red Flag symptoms and signs in detail. Patient expressed understanding regarding what to do in case of urgent or emergency type symptoms.   Medication list was reconciled, printed and provided to the patient in AVS. Patient instructions and summary information was reviewed with the patient as documented in the AVS. This note was prepared with assistance of Dragon voice recognition software. Occasional wrong-word or sound-a-like substitutions may have occurred due to the inherent limitations of voice recognition software  Orders Placed This Encounter  Procedures  . HM PAP SMEAR  . Comprehensive metabolic panel  . CBC with Differential/Platelet  . Lipid panel  . Ambulatory referral to Dermatology   No orders of the defined types  were placed in this encounter.

## 2018-01-01 NOTE — Patient Instructions (Addendum)
Schedule your next Physical with Dr. Jonni Sanger in 1 year  Please go to the Lab for blood work.    If you have MyChart, your results will be available to view, otherwise we will call you with results.  We will schedule follow-up according to results.   We will call you with information regarding your referral appointment for Dermatology.   Please do these things to maintain good health!   Exercise at least 30-45 minutes a day,  4-5 days a week.   Eat a low-fat diet with lots of fruits and vegetables, up to 7-9 servings per day.  Drink plenty of water daily. Try to drink 8 8oz glasses per day.  Seatbelts can save your life. Always wear your seatbelt.  Place Smoke Detectors on every level of your home and check batteries every year.  Schedule an appointment with an eye doctor for an eye exam every 1-2 years  Safe sex - use condoms to protect yourself from STDs if you could be exposed to these types of infections. Use birth control if you do not want to become pregnant and are sexually active.  Avoid heavy alcohol use. If you drink, keep it to less than 2 drinks/day and not every day.  [place]ha.  Choose someone you trust that could speak for you if you became unable to speak for yourself.  Depression is common in our stressful world.If you're feeling down or losing interest in things you normally enjoy, please come in for a visit.  If anyone is threatening or hurting you, please get help. Physical or Emotional Violence is never OK.    Steps to Quit Smoking Smoking tobacco can be bad for your health. It can also affect almost every organ in your body. Smoking puts you and people around you at risk for many serious long-lasting (chronic) diseases. Quitting smoking is hard, but it is one of the best things that you can do for your health. It is never too late to quit. What are the benefits of quitting smoking? When you quit smoking, you lower your risk for getting  serious diseases and conditions. They can include:  Lung cancer or lung disease.  Heart disease.  Stroke.  Heart attack.  Not being able to have children (infertility).  Weak bones (osteoporosis) and broken bones (fractures).  If you have coughing, wheezing, and shortness of breath, those symptoms may get better when you quit. You may also get sick less often. If you are pregnant, quitting smoking can help to lower your chances of having a baby of low birth weight. What can I do to help me quit smoking? Talk with your doctor about what can help you quit smoking. Some things you can do (strategies) include:  Quitting smoking totally, instead of slowly cutting back how much you smoke over a period of time.  Going to in-person counseling. You are more likely to quit if you go to many counseling sessions.  Using resources and support systems, such as: ? Database administrator with a Social worker. ? Phone quitlines. ? Careers information officer. ? Support groups or group counseling. ? Text messaging programs. ? Mobile phone apps or applications.  Taking medicines. Some of these medicines may have nicotine in them. If you are pregnant or breastfeeding, do not take any medicines to quit smoking unless your doctor says it is okay. Talk with your doctor about counseling or other things that can help you.  Talk with your doctor about using more  than one strategy at the same time, such as taking medicines while you are also going to in-person counseling. This can help make quitting easier. What things can I do to make it easier to quit? Quitting smoking might feel very hard at first, but there is a lot that you can do to make it easier. Take these steps:  Talk to your family and friends. Ask them to support and encourage you.  Call phone quitlines, reach out to support groups, or work with a Social worker.  Ask people who smoke to not smoke around you.  Avoid places that make you want (trigger) to  smoke, such as: ? Bars. ? Parties. ? Smoke-break areas at work.  Spend time with people who do not smoke.  Lower the stress in your life. Stress can make you want to smoke. Try these things to help your stress: ? Getting regular exercise. ? Deep-breathing exercises. ? Yoga. ? Meditating. ? Doing a body scan. To do this, close your eyes, focus on one area of your body at a time from head to toe, and notice which parts of your body are tense. Try to relax the muscles in those areas.  Download or buy apps on your mobile phone or tablet that can help you stick to your quit plan. There are many free apps, such as QuitGuide from the [employer] Office manager for Disease Control and Prevention). You can find more support from smokefree.gov and other websites.  This information is not intended to replace advice given to you by your health care provider. Make sure you discuss any questions you have with your health care provider. Document Released: 06/18/2009 Document Revised: 04/19/2016 Document Reviewed: 01/06/2015 Elsevier Interactive Patient Education  2018 [employer].

## 2018-01-02 ENCOUNTER — Encounter: Payer: Self-pay | Admitting: Family Medicine

## 2018-01-02 LAB — CYTOLOGY - PAP
Diagnosis: NEGATIVE
HPV: NOT DETECTED

## 2018-01-18 ENCOUNTER — Telehealth: Payer: Self-pay | Admitting: Family Medicine

## 2018-01-18 NOTE — Telephone Encounter (Signed)
Nicotine patches are OTC; she needs to purchase them. No RX needed.

## 2018-01-18 NOTE — Telephone Encounter (Signed)
Copied from [place] [id number]. Topic: Quick Communication - Rx Refill/Question >> Jan 18, 2018 11:20 AM Margot Ables wrote: Medication: Smoking cessation counseling done. Nicotine patches 14mg  x 4 weeks, then 7mg  x 4 weeks. Discussed quit plan. See avs. F/u if needs more help Has the patient contacted their pharmacy? Yes - pt calling because the pharmacy states no prescriptions were received. Preferred Pharmacy (with phone number or street name): [place], [place] - [address] [phone] (Phone) [phone] (Fax)

## 2018-01-18 NOTE — Telephone Encounter (Signed)
Unable to leave a message, I will try back at a later time.   Doloris Melchior,  LPN

## 2018-01-19 NOTE — Telephone Encounter (Signed)
Patient informed to purchase Nicotine patches over the counter.   Doloris Lobb,  LPN

## 2018-11-19 ENCOUNTER — Encounter: Payer: Self-pay | Admitting: Family Medicine

## 2019-01-03 ENCOUNTER — Encounter: Payer: Managed Care, Other (non HMO) | Admitting: Family Medicine

## 2019-01-04 ENCOUNTER — Other Ambulatory Visit: Payer: Self-pay

## 2019-01-04 ENCOUNTER — Encounter: Payer: Self-pay | Admitting: Family Medicine

## 2019-01-04 ENCOUNTER — Ambulatory Visit (INDEPENDENT_AMBULATORY_CARE_PROVIDER_SITE_OTHER): Payer: BLUE CROSS/BLUE SHIELD | Admitting: Family Medicine

## 2019-01-04 DIAGNOSIS — B373 Candidiasis of vulva and vagina: Secondary | ICD-10-CM | POA: Diagnosis not present

## 2019-01-04 DIAGNOSIS — F17210 Nicotine dependence, cigarettes, uncomplicated: Secondary | ICD-10-CM

## 2019-01-04 DIAGNOSIS — B3731 Acute candidiasis of vulva and vagina: Secondary | ICD-10-CM

## 2019-01-04 NOTE — Progress Notes (Signed)
Virtual Visit via Video Note  Subjective  CC:  Chief Complaint  Patient presents with   Vaginitis    She reports she started using a natural medication (sea moss gel) Monday and symptoms started yesterday     I connected with Park Liter on 01/05/19 at  3:40 PM EDT by a video enabled telemedicine application and verified that I am speaking with the correct person using two identifiers. Location patient: Home Location provider: [facility] [place], Office Persons participating in the virtual visit: Merrillyn Ackerley, Leamon Arnt, MD Lilli Light, [place] discussed the limitations of evaluation and management by telemedicine and the availability of in person appointments. The patient expressed understanding and agreed to proceed. HPI: Shelley Williams is a 43 y.o. female who was contacted today to address the problems listed above in the chief complaint.  43 yo with white itching discharge x 2 days w/o odor, pelvic pain, f/c/s or abdominal pain.  He has history of recurrent vaginitis but has not been a problem for a while.  She wonders if this is related to a new supplement, see Moss.  Otherwise feeling fine.  Denies risk of STDs.  Has used Diflucan in the past without problems  Smoking: She had use the patch last year and was successful for quitting for about 2 months.  Due to increased stressors, she restarted smoking again.  She is currently interested in quitting again and will restart the patch. Assessment  1. Yeast vaginitis   2. Nicotine dependence, cigarettes, uncomplicated      Plan   Yeast vaginitis: Clinically consistent with yeast.  Education given.  Diflucan as needed.  If becomes recurrent problem again, return for evaluation  Smoking cessation counseling done.  Continue patch.  Recommend quitting. I discussed the assessment and treatment plan with the patient. The patient was provided an opportunity to ask questions and all were answered. The patient  agreed with the plan and demonstrated an understanding of the instructions.   The patient was advised to call back or seek an in-person evaluation if the symptoms worsen or if the condition fails to improve as anticipated. Follow up: Return for physical 04/01/2019  Meds ordered this encounter  Medications   fluconazole (DIFLUCAN) 150 MG tablet    Sig: Take one tablet today; may repeat in 3 days if symptoms persist    Dispense:  2 tablet    Refill:  1      I reviewed the patients updated PMH, FH, and SocHx.    Patient Active Problem List   Diagnosis Date Noted   Herpes simplex type 2 infection 01/01/2018   Nicotine dependence, cigarettes, uncomplicated 23/76/2831   Pruritus 01/01/2018   Current Meds  Medication Sig   ELDERBERRY PO Take by mouth.   OVER THE COUNTER MEDICATION Sea-moss    Allergies: Patient has No Known Allergies. Family History: Patient family history includes Arthritis in her mother; Asthma in her mother; GER disease in her father; Glaucoma in her father; Healthy in her daughter and son; Heart disease in her maternal grandmother; Hyperlipidemia in her father; Hypertension in her father and mother; Lung cancer in her paternal grandfather; Pancreatic cancer in her father. Social History:  Patient  reports that she has been smoking cigarettes. She has been smoking about 0.50 packs per day. She has never used smokeless tobacco. She reports that she does not drink alcohol or use drugs.  Review of Systems: Constitutional: Negative for fever  malaise or anorexia Cardiovascular: negative for chest pain Respiratory: negative for SOB or persistent cough Gastrointestinal: negative for abdominal pain  OBJECTIVE Vitals: There were no vitals taken for this visit. General: no acute distress , A&Ox3  Leamon Arnt, MD

## 2019-01-05 ENCOUNTER — Encounter: Payer: Self-pay | Admitting: Family Medicine

## 2019-01-05 MED ORDER — FLUCONAZOLE 150 MG PO TABS
ORAL_TABLET | ORAL | 1 refills | Status: DC
Start: 2019-01-05 — End: 2019-03-21

## 2019-01-10 ENCOUNTER — Encounter: Payer: Self-pay | Admitting: Family Medicine

## 2019-01-11 ENCOUNTER — Other Ambulatory Visit: Payer: Self-pay

## 2019-01-11 ENCOUNTER — Encounter: Payer: Self-pay | Admitting: Family Medicine

## 2019-01-11 ENCOUNTER — Ambulatory Visit (INDEPENDENT_AMBULATORY_CARE_PROVIDER_SITE_OTHER): Payer: Managed Care, Other (non HMO) | Admitting: Family Medicine

## 2019-01-11 VITALS — Temp 97.5°F | Ht 64.5 in | Wt 110.0 lb

## 2019-01-11 DIAGNOSIS — L209 Atopic dermatitis, unspecified: Secondary | ICD-10-CM | POA: Diagnosis not present

## 2019-01-11 MED ORDER — FLUTICASONE PROPIONATE 0.05 % EX CREA: TOPICAL_CREAM | Freq: Two times a day (BID) | CUTANEOUS | 1 refills | Status: DC

## 2019-01-11 NOTE — Assessment & Plan Note (Signed)
Evaluated by Dr. Nevada Crane in the past. Sounds allergic since only happens when she visits her mother's home. Resolves with one treatment of cream. Refilled. Warned against frequent use due to possible side effects.

## 2019-01-11 NOTE — Progress Notes (Signed)
Virtual Visit via Video Note  Subjective  CC:  Chief Complaint  Patient presents with  . Medication Management     I connected with Shelley Williams on 01/11/19 at  1:20 PM EDT by a video enabled telemedicine application and verified that I am speaking with the correct person using two identifiers. Location patient: Home Location provider: [facility] [place], Office Persons participating in the virtual visit: Marion Seese, Leamon Arnt, MD Cain Saupe, CMA  I discussed the limitations of evaluation and management by telemedicine and the availability of in person appointments. The patient expressed understanding and agreed to proceed. HPI: Shelley Williams is a 43 y.o. female who was contacted today to address the problems listed above in the chief complaint. . 43 yo c/o rash on face: gets stinging red papules on her cheeks but only after visiting her mom's home. She has used fluticasone 0.05% cream from derm after evaluation and it typically resolves after one application. Told it was eczema. No flaking. No itching. Not spreading. No associated sxs. Otherwise feels well. Would like a refill. Has outstanding balance at derm office.   Assessment  1. Atopic dermatitis, unspecified type      Plan   See below for problem based assessment and plan documentation I discussed the assessment and treatment plan with the patient. The patient was provided an opportunity to ask questions and all were answered. The patient agreed with the plan and demonstrated an understanding of the instructions.   The patient was advised to call back or seek an in-person evaluation if the symptoms worsen or if the condition fails to improve as anticipated. Follow up: prn  04/01/2019  Meds ordered this encounter  Medications  . fluticasone (CUTIVATE) 0.05 % cream    Sig: Apply topically 2 (two) times daily.    Dispense:  30 g    Refill:  1      I reviewed the patients updated PMH,  FH, and SocHx.    Patient Active Problem List   Diagnosis Date Noted  . Atopic dermatitis 01/11/2019  . Herpes simplex type 2 infection 01/01/2018  . Nicotine dependence, cigarettes, uncomplicated 05/39/7673  . Pruritus 01/01/2018   Current Meds  Medication Sig  . ELDERBERRY PO Take by mouth.  . fluconazole (DIFLUCAN) 150 MG tablet Take one tablet today; may repeat in 3 days if symptoms persist  . naproxen sodium (ANAPROX) 275 MG tablet   . OVER THE COUNTER MEDICATION Sea-moss    Allergies: Patient has No Known Allergies. Family History: Patient family history includes Arthritis in her mother; Asthma in her mother; GER disease in her father; Glaucoma in her father; Healthy in her daughter and son; Heart disease in her maternal grandmother; Hyperlipidemia in her father; Hypertension in her father and mother; Lung cancer in her paternal grandfather; Pancreatic cancer in her father. Social History:  Patient  reports that she has been smoking cigarettes. She has been smoking about 0.50 packs per day. She has never used smokeless tobacco. She reports that she does not drink alcohol or use drugs.  Review of Systems: Constitutional: Negative for fever malaise or anorexia Cardiovascular: negative for chest pain Respiratory: negative for SOB or persistent cough Gastrointestinal: negative for abdominal pain  OBJECTIVE Vitals: Temp (!) 97.5 F (36.4 C) (Oral)   Ht 5' 4.5" (1.638 m)   Wt 110 lb (49.9 kg)   LMP 12/29/2018 (Exact Date)   BMI 18.59 kg/m  General: no acute  distress , A&Ox3 Skin: face appears clear, hard to make out fine detail on video  Leamon Arnt, MD

## 2019-03-21 ENCOUNTER — Encounter: Payer: Self-pay | Admitting: Family Medicine

## 2019-03-21 ENCOUNTER — Ambulatory Visit (INDEPENDENT_AMBULATORY_CARE_PROVIDER_SITE_OTHER): Payer: BC Managed Care – PPO | Admitting: Family Medicine

## 2019-03-21 ENCOUNTER — Encounter: Payer: Medicaid Other | Admitting: Family Medicine

## 2019-03-21 ENCOUNTER — Telehealth: Payer: Self-pay

## 2019-03-21 ENCOUNTER — Other Ambulatory Visit (HOSPITAL_COMMUNITY)
Admission: RE | Admit: 2019-03-21 | Discharge: 2019-03-21 | Disposition: A | Discharge status: Home / Self Care | Payer: BC Managed Care – PPO | Source: Ambulatory Visit | Attending: Family Medicine | Admitting: Family Medicine

## 2019-03-21 VITALS — BP 100/66 | HR 84 | Temp 98.2°F | Resp 14 | Ht 64.5 in | Wt 124.6 lb

## 2019-03-21 DIAGNOSIS — N898 Other specified noninflammatory disorders of vagina: Secondary | ICD-10-CM | POA: Insufficient documentation

## 2019-03-21 DIAGNOSIS — Z7251 High risk heterosexual behavior: Secondary | ICD-10-CM | POA: Insufficient documentation

## 2019-03-21 MED ORDER — METRONIDAZOLE 500 MG PO TABS: 500.0000 mg | ORAL_TABLET | Freq: Two times a day (BID) | ORAL | 0 refills | Status: AC

## 2019-03-21 NOTE — Telephone Encounter (Signed)
See note

## 2019-03-21 NOTE — Telephone Encounter (Signed)
Copied from [place] [id number]. Topic: Appointment Scheduling - Scheduling Inquiry for Clinic >> Mar 21, 2019 10:22 AM Yvette Rack wrote: Reason for CRM: Pt stated she would like to schedule an appt with Dr. Jonni Sanger to be checked out because she noticed an odor in her private area. Attempted to transfer the call to the office but the line remained busy. Pt requests call back.

## 2019-03-21 NOTE — Progress Notes (Signed)
Subjective  CC:  Chief Complaint  Patient presents with  . Vaginal Odor    Noticed yesterday, recently had unprotected sex    HPI: Shelley Williams is a 43 y.o. female who presents to the office today to address the problems listed above in the chief complaint.  Patient presents for evaluation of an abnormal vaginal odor: she describes  Odor x  2 days; on cycle so hasn't noted a discharge without f/c/s, sores, pelvic pain. Sexually transmitted infection risk: Possible STD exposure-  Has had recent unprotected intercourse with her ex. The patient denies history of sexually transmitted disease.. She declines serology testing and HIV testing today. She denies urinary sxs. She uses Tubal Ligation for birth control.  I reviewed the patients updated PMH, FH, and SocHx.    Patient Active Problem List   Diagnosis Date Noted  . Atopic dermatitis 01/11/2019  . Herpes simplex type 2 infection 01/01/2018  . Nicotine dependence, cigarettes, uncomplicated 16/06/9603  . Pruritus 01/01/2018   Current Meds  Medication Sig  . ELDERBERRY PO Take by mouth.  Marland Kitchen OVER THE COUNTER MEDICATION [place]  . [DISCONTINUED] naproxen sodium (ANAPROX) 275 MG tablet     Allergies: Patient has No Known Allergies. Family History: Patient family history includes Arthritis in her mother; Asthma in her mother; GER disease in her father; Glaucoma in her father; Healthy in her daughter and son; Heart disease in her maternal grandmother; Hyperlipidemia in her father; Hypertension in her father and mother; Lung cancer in her paternal grandfather; Pancreatic cancer in her father. Social History:  Patient  reports that she has been smoking cigarettes. She has been smoking about 0.50 packs per day. She has never used smokeless tobacco. She reports that she does not drink alcohol or use drugs.  Review of Systems: Constitutional: Negative for fever malaise or anorexia Cardiovascular: negative for chest pain Respiratory:  negative for SOB or persistent cough Gastrointestinal: negative for abdominal pain  Objective  Vitals: BP 100/66   Pulse 84   Temp 98.2 F (36.8 C) (Oral)   Resp 14   Ht 5' 4.5" (1.638 m)   Wt 124 lb 9.6 oz (56.5 kg)   LMP 03/19/2019   SpO2 100%   BMI 21.06 kg/m  General: no acute distress , A&Ox3 HEENT: PEERL, conjunctiva normal, Oropharynx moist,neck is supple Cardiovascular:  RRR without murmur or gallop.  Gastrointestinal: soft, flat abdomen, normal active bowel sounds, no palpable masses, no hepatosplenomegaly, no appreciated hernias GYN: deferred Skin:  Warm, no rashes  Assessment  1. Vaginal odor   2. High risk heterosexual behavior      Plan   Vag odor:  ? BV but will r/o STDs. Safe sex counseling done. Flagyl empirically IF odor persists prior to test results being back. RX given.   Follow up: Return for complete physical.    Commons side effects, risks, benefits, and alternatives for medications and treatment plan prescribed today were discussed, and the patient expressed understanding of the given instructions. Patient is instructed to call or message via MyChart if he/she has any questions or concerns regarding our treatment plan. No barriers to understanding were identified. We discussed Red Flag symptoms and signs in detail. Patient expressed understanding regarding what to do in case of urgent or emergency type symptoms.   Medication list was reconciled, printed and provided to the patient in AVS. Patient instructions and summary information was reviewed with the patient as documented in the AVS. This note was prepared with assistance of Dragon  voice recognition software. Occasional wrong-word or sound-a-like substitutions may have occurred due to the inherent limitations of voice recognition software  Orders Placed This Encounter  Procedures  . RPR  . HIV Antibody (routine testing w rflx)   Meds ordered this encounter  Medications  . metroNIDAZOLE  (FLAGYL) 500 MG tablet    Sig: Take 1 tablet (500 mg total) by mouth 2 (two) times daily for 7 days.    Dispense:  14 tablet    Refill:  0

## 2019-03-21 NOTE — Patient Instructions (Signed)
Please schedule an appointment for your complete physical. Please come fasting.   I have printed a prescription for flagyl for you to start in the next 2 days IF your symptoms persist. I will get your lab results to you next week if they are not back tomorrow.  Use condoms to protect yourself.

## 2019-03-22 LAB — RPR: RPR Ser Ql: NONREACTIVE

## 2019-03-22 LAB — HIV ANTIBODY (ROUTINE TESTING W REFLEX): HIV 1&2 Ab, 4th Generation: NONREACTIVE

## 2019-03-22 NOTE — Progress Notes (Signed)
This encounter was created in error - please disregard.

## 2019-03-25 LAB — CERVICOVAGINAL ANCILLARY ONLY
Bacterial vaginitis: NEGATIVE
Candida vaginitis: NEGATIVE
Chlamydia: NEGATIVE
Neisseria Gonorrhea: NEGATIVE
Trichomonas: NEGATIVE

## 2019-03-28 ENCOUNTER — Encounter: Payer: Self-pay | Admitting: Family Medicine

## 2019-03-29 NOTE — Telephone Encounter (Signed)
Spoke with pt, she has been able to remove the tampon and will be in for her physical appt on Monday.

## 2019-04-01 ENCOUNTER — Encounter: Payer: Self-pay | Admitting: Family Medicine

## 2019-04-01 ENCOUNTER — Other Ambulatory Visit: Payer: Self-pay

## 2019-04-01 ENCOUNTER — Ambulatory Visit (INDEPENDENT_AMBULATORY_CARE_PROVIDER_SITE_OTHER): Payer: BC Managed Care – PPO | Admitting: Family Medicine

## 2019-04-01 VITALS — BP 114/62 | HR 91 | Temp 98.2°F | Ht 64.5 in | Wt 125.2 lb

## 2019-04-01 DIAGNOSIS — F17210 Nicotine dependence, cigarettes, uncomplicated: Secondary | ICD-10-CM | POA: Diagnosis not present

## 2019-04-01 DIAGNOSIS — Z0000 Encounter for general adult medical examination without abnormal findings: Secondary | ICD-10-CM

## 2019-04-01 NOTE — Patient Instructions (Addendum)
Please return in 12 months for your annual complete physical; please come fasting.  I will release your lab results to you on your MyChart account with further instructions. Please reply with any questions.   Quit smoking! If you have any questions or concerns, please don't hesitate to send me a message via MyChart or call the office at [phone]. Thank you for visiting with [place] today! It's our pleasure caring for you.   Preventive Care 32-43 Years Old, Female Preventive care refers to visits with your health care provider and lifestyle choices that can promote health and wellness. This includes:  A yearly physical exam. This may also be called an annual well check.  Regular dental visits and eye exams.  Immunizations.  Screening for certain conditions.  Healthy lifestyle choices, such as eating a healthy diet, getting regular exercise, not using drugs or products that contain nicotine and tobacco, and limiting alcohol use. What can I expect for my preventive care visit? Physical exam Your health care provider will check your:  Height and weight. This may be used to calculate body mass index (BMI), which tells if you are at a healthy weight.  Heart rate and blood pressure.  Skin for abnormal spots. Counseling Your health care provider may ask you questions about your:  Alcohol, tobacco, and drug use.  Emotional well-being.  Home and relationship well-being.  Sexual activity.  Eating habits.  Work and work Statistician.  Method of birth control.  Menstrual cycle.  Pregnancy history. What immunizations do I need?  Influenza (flu) vaccine  This is recommended every year. Tetanus, diphtheria, and pertussis (Tdap) vaccine  You may need a Td booster every 10 years. Varicella (chickenpox) vaccine  You may need this if you have not been vaccinated. Zoster (shingles) vaccine  You may need this after age 19. Measles, mumps, and rubella (MMR) vaccine  You may  need at least one dose of MMR if you were born in 1957 or later. You may also need a second dose. Pneumococcal conjugate (PCV13) vaccine  You may need this if you have certain conditions and were not previously vaccinated. Pneumococcal polysaccharide (PPSV23) vaccine  You may need one or two doses if you smoke cigarettes or if you have certain conditions. Meningococcal conjugate (MenACWY) vaccine  You may need this if you have certain conditions. Hepatitis A vaccine  You may need this if you have certain conditions or if you travel or work in places where you may be exposed to hepatitis A. Hepatitis B vaccine  You may need this if you have certain conditions or if you travel or work in places where you may be exposed to hepatitis B. Haemophilus influenzae type b (Hib) vaccine  You may need this if you have certain conditions. Human papillomavirus (HPV) vaccine  If recommended by your health care provider, you may need three doses over 6 months. You may receive vaccines as individual doses or as more than one vaccine together in one shot (combination vaccines). Talk with your health care provider about the risks and benefits of combination vaccines. What tests do I need? Blood tests  Lipid and cholesterol levels. These may be checked every 5 years, or more frequently if you are over 30 years old.  Hepatitis C test.  Hepatitis B test. Screening  Lung cancer screening. You may have this screening every year starting at age 44 if you have a 30-pack-year history of smoking and currently smoke or have quit within the past 15 years.  Colorectal cancer screening. All adults should have this screening starting at age 100 and continuing until age 61. Your health care provider may recommend screening at age 79 if you are at increased risk. You will have tests every 1-10 years, depending on your results and the type of screening test.  Diabetes screening. This is done by checking your blood  sugar (glucose) after you have not eaten for a while (fasting). You may have this done every 1-3 years.  Mammogram. This may be done every 1-2 years. Talk with your health care provider about when you should start having regular mammograms. This may depend on whether you have a family history of breast cancer.  BRCA-related cancer screening. This may be done if you have a family history of breast, ovarian, tubal, or peritoneal cancers.  Pelvic exam and Pap test. This may be done every 3 years starting at age 20. Starting at age 53, this may be done every 5 years if you have a Pap test in combination with an HPV test. Other tests  Sexually transmitted disease (STD) testing.  Bone density scan. This is done to screen for osteoporosis. You may have this scan if you are at high risk for osteoporosis. Follow these instructions at home: Eating and drinking  Eat a diet that includes fresh fruits and vegetables, whole grains, lean protein, and low-fat dairy.  Take vitamin and mineral supplements as recommended by your health care provider.  Do not drink alcohol if: ? Your health care provider tells you not to drink. ? You are pregnant, may be pregnant, or are planning to become pregnant.  If you drink alcohol: ? Limit how much you have to 0-1 drink a day. ? Be aware of how much alcohol is in your drink. In the [place] one drink equals one 12 oz bottle of beer (355 mL), one 5 oz glass of wine (148 mL), or one 1 oz glass of hard liquor (44 mL). Lifestyle  Take daily care of your teeth and gums.  Stay active. Exercise for at least 30 minutes on 5 or more days each week.  Do not use any products that contain nicotine or tobacco, such as cigarettes, e-cigarettes, and chewing tobacco. If you need help quitting, ask your health care provider.  If you are sexually active, practice safe sex. Use a condom or other form of birth control (contraception) in order to prevent pregnancy and STIs (sexually  transmitted infections).  If told by your health care provider, take low-dose aspirin daily starting at age 75. What's next?  Visit your health care provider once a year for a well check visit.  Ask your health care provider how often you should have your eyes and teeth checked.  Stay up to date on all vaccines. This information is not intended to replace advice given to you by your health care provider. Make sure you discuss any questions you have with your health care provider. Document Released: 09/18/2015 Document Revised: 05/03/2018 Document Reviewed: 05/03/2018 Elsevier Patient Education  2020 [employer].

## 2019-04-01 NOTE — Progress Notes (Signed)
Subjective  Chief Complaint  Patient presents with  . Annual Exam    HPI: Shelley Williams is a 43 y.o. female who presents to [facility] at [place] today for a Female Wellness Visit.   Wellness Visit: annual visit with health maintenance review and exam without Pap   HM: up to date. Reports nl mammo in December. Healthy. Smoker! Precontemplative.   Assessment  1. Annual physical exam   2. Nicotine dependence, cigarettes, uncomplicated      Plan  Female Wellness Visit:  Age appropriate Health Maintenance and Prevention measures were discussed with patient. Included topics are cancer screening recommendations, ways to keep healthy (see AVS) including dietary and exercise recommendations, regular eye and dental care, use of seat belts, and avoidance of moderate alcohol use and tobacco use.   BMI: discussed patient's BMI and encouraged positive lifestyle modifications to help get to or maintain a target BMI.  HM needs and immunizations were addressed and ordered. See below for orders. See HM and immunization section for updates.  Routine labs and screening tests ordered including cmp, cbc and lipids where appropriate.  Discussed recommendations regarding Vit D and calcium supplementation (see AVS)  Follow up: Return in about 1 year (around 03/31/2020) for complete physical.   Orders Placed This Encounter  Procedures  . CBC with Differential/Platelet  . Comprehensive metabolic panel  . Lipid panel   No orders of the defined types were placed in this encounter.     Lifestyle: Body mass index is 21.16 kg/m. Wt Readings from Last 3 Encounters:  04/01/19 125 lb 3.2 oz (56.8 kg)  03/21/19 124 lb 9.6 oz (56.5 kg)  01/11/19 110 lb (49.9 kg)   Diet: general Exercise: intermittently,    Patient Active Problem List   Diagnosis Date Noted  . Atopic dermatitis 01/11/2019  . Herpes simplex type 2 infection 01/01/2018    Overview:  Rare outbreak   . Nicotine  dependence, cigarettes, uncomplicated 35/36/1443  . Pruritus 01/01/2018   Health Maintenance  Topic Date Due  . INFLUENZA VACCINE  04/06/2019  . PAP SMEAR-Modifier  01/02/2023  . TETANUS/TDAP  11/17/2024  . HIV Screening  Completed   Immunization History  Administered Date(s) Administered  . Hepatitis B, ped/adol 05/30/2014  . Influenza, Seasonal, Injecte, Preservative Fre 06/05/2016  . Influenza,inj,quad, With Preservative 06/05/2014  . Tdap 09/05/2010, 06/03/2011, 11/18/2014   We updated and reviewed the patient's past history in detail and it is documented below. Allergies: Patient has No Known Allergies. Past Medical History Patient  has a past medical history of Benign gestational thrombocytopenia ([place]) (01/01/2018), HSV (herpes simplex virus) infection, and Postpartum depression (01/01/2018). Past Surgical History Patient  has a past surgical history that includes Wisdom tooth extraction and Tubal ligation (Bilateral, 02/01/2015). Family History: Patient family history includes Arthritis in her mother; Asthma in her mother; GER disease in her father; Glaucoma in her father; Healthy in her daughter and son; Heart disease in her maternal grandmother; Hyperlipidemia in her father; Hypertension in her father and mother; Lung cancer in her paternal grandfather; Pancreatic cancer in her father. Social History:  Patient  reports that she has been smoking cigarettes. She has been smoking about 0.50 packs per day. She has never used smokeless tobacco. She reports that she does not drink alcohol or use drugs.  Review of Systems: Constitutional: negative for fever or malaise Ophthalmic: negative for photophobia, double vision or loss of vision Cardiovascular: negative for chest pain, dyspnea on exertion, or new LE  swelling Respiratory: negative for SOB or persistent cough Gastrointestinal: negative for abdominal pain, change in bowel habits or melena Genitourinary: negative for dysuria or  gross hematuria, no abnormal uterine bleeding or disharge Musculoskeletal: negative for new gait disturbance or muscular weakness Integumentary: negative for new or persistent rashes, no breast lumps Neurological: negative for TIA or stroke symptoms Psychiatric: negative for SI or delusions Allergic/Immunologic: negative for hives Patient Care Team    Relationship Specialty Notifications Start End  Leamon Arnt, MD PCP - General Family Medicine  01/01/18     Objective  Vitals: BP 114/62 (BP Location: Left Arm, Patient Position: Sitting, Cuff Size: Normal)   Pulse 91   Temp 98.2 F (36.8 C) (Oral)   Ht 5' 4.5" (1.638 m)   Wt 125 lb 3.2 oz (56.8 kg)   LMP 03/19/2019   SpO2 98%   BMI 21.16 kg/m  General:  Well developed, well nourished, no acute distress  Psych:  Alert and orientedx3,normal mood and affect HEENT:  Normocephalic, atraumatic, non-icteric sclera, PERRL, oropharynx is clear without mass or exudate, supple neck without adenopathy, mass or thyromegaly Cardiovascular:  Normal S1, S2, RRR without gallop, rub or murmur, nondisplaced PMI Respiratory:  Good breath sounds bilaterally, CTAB with normal respiratory effort Gastrointestinal: normal bowel sounds, soft, non-tender, no noted masses. No HSM MSK: no deformities, contusions. Joints are without erythema or swelling. Spine and CVA region are nontender Skin:  Warm, no rashes or suspicious lesions noted Neurologic:    Mental status is normal. CN 2-11 are normal. Gross motor and sensory exams are normal. Normal gait. No tremor Breast Exam: No mass, skin retraction or nipple discharge is appreciated in either breast. No axillary adenopathy. Fibrocystic changes are not noted   Commons side effects, risks, benefits, and alternatives for medications and treatment plan prescribed today were discussed, and the patient expressed understanding of the given instructions. Patient is instructed to call or message via MyChart if he/she  has any questions or concerns regarding our treatment plan. No barriers to understanding were identified. We discussed Red Flag symptoms and signs in detail. Patient expressed understanding regarding what to do in case of urgent or emergency type symptoms.   Medication list was reconciled, printed and provided to the patient in AVS. Patient instructions and summary information was reviewed with the patient as documented in the AVS. This note was prepared with assistance of Dragon voice recognition software. Occasional wrong-word or sound-a-like substitutions may have occurred due to the inherent limitations of voice recognition software

## 2019-04-02 LAB — COMPREHENSIVE METABOLIC PANEL
ALT: 14 U/L (ref 0–35)
AST: 23 U/L (ref 0–37)
Albumin: 4.4 g/dL (ref 3.5–5.2)
Alkaline Phosphatase: 41 U/L (ref 39–117)
BUN: 11 mg/dL (ref 6–23)
CO2: 23 mEq/L (ref 19–32)
Calcium: 9.4 mg/dL (ref 8.4–10.5)
Chloride: 103 mEq/L (ref 96–112)
Creatinine, Ser: 0.76 mg/dL (ref 0.40–1.20)
GFR: 100.63 mL/min (ref >60.00)
Glucose, Bld: 83 mg/dL (ref 70–99)
Potassium: 3.7 mEq/L (ref 3.5–5.1)
Sodium: 135 mEq/L (ref 135–145)
Total Bilirubin: 0.5 mg/dL (ref 0.2–1.2)
Total Protein: 7.1 g/dL (ref 6.0–8.3)

## 2019-04-02 LAB — CBC WITH DIFFERENTIAL/PLATELET
Basophils Absolute: 0.1 10*3/uL (ref 0.0–0.1)
Basophils Relative: 0.7 % (ref 0.0–3.0)
Eosinophils Absolute: 0.3 10*3/uL (ref 0.0–0.7)
Eosinophils Relative: 3.8 % (ref 0.0–5.0)
HCT: 37.1 % (ref 36.0–46.0)
Hemoglobin: 12.3 g/dL (ref 12.0–15.0)
Lymphocytes Relative: 31.1 % (ref 12.0–46.0)
Lymphs Abs: 2.3 10*3/uL (ref 0.7–4.0)
MCHC: 33.3 g/dL (ref 30.0–36.0)
MCV: 101.2 fl — ABNORMAL HIGH (ref 78.0–100.0)
Monocytes Absolute: 0.6 10*3/uL (ref 0.1–1.0)
Monocytes Relative: 8.1 % (ref 3.0–12.0)
Neutro Abs: 4.2 10*3/uL (ref 1.4–7.7)
Neutrophils Relative %: 56.3 % (ref 43.0–77.0)
Platelets: 225 10*3/uL (ref 150.0–400.0)
RBC: 3.66 Mil/uL — ABNORMAL LOW (ref 3.87–5.11)
RDW: 13.9 % (ref 11.5–15.5)
WBC: 7.5 10*3/uL (ref 4.0–10.5)

## 2019-04-02 LAB — LIPID PANEL
Cholesterol: 227 mg/dL — ABNORMAL HIGH (ref 0–200)
HDL: 83.3 mg/dL (ref >39.00)
LDL Cholesterol: 130 mg/dL — ABNORMAL HIGH (ref 0–99)
NonHDL: 143.26
Total CHOL/HDL Ratio: 3
Triglycerides: 65 mg/dL (ref 0.0–149.0)
VLDL: 13 mg/dL (ref 0.0–40.0)

## 2019-05-23 ENCOUNTER — Encounter: Payer: Self-pay | Admitting: Family Medicine

## 2019-05-24 ENCOUNTER — Encounter: Payer: Self-pay | Admitting: Family Medicine

## 2019-05-24 MED ORDER — METRONIDAZOLE 500 MG PO TABS: 500.0000 mg | ORAL_TABLET | Freq: Two times a day (BID) | ORAL | 0 refills | Status: AC

## 2019-06-26 ENCOUNTER — Encounter: Payer: Self-pay | Admitting: Family Medicine

## 2019-07-18 ENCOUNTER — Encounter: Payer: Self-pay | Admitting: Family Medicine

## 2019-07-18 ENCOUNTER — Ambulatory Visit (INDEPENDENT_AMBULATORY_CARE_PROVIDER_SITE_OTHER): Payer: BC Managed Care – PPO | Admitting: Family Medicine

## 2019-07-18 ENCOUNTER — Other Ambulatory Visit: Payer: Self-pay

## 2019-07-18 ENCOUNTER — Other Ambulatory Visit (HOSPITAL_COMMUNITY)
Admission: RE | Admit: 2019-07-18 | Discharge: 2019-07-18 | Disposition: A | Discharge status: Home / Self Care | Payer: BC Managed Care – PPO | Source: Ambulatory Visit | Attending: Family Medicine | Admitting: Family Medicine

## 2019-07-18 VITALS — BP 90/60 | HR 85 | Temp 98.2°F | Ht 64.5 in | Wt 119.0 lb

## 2019-07-18 DIAGNOSIS — Z803 Family history of malignant neoplasm of breast: Secondary | ICD-10-CM

## 2019-07-18 DIAGNOSIS — N898 Other specified noninflammatory disorders of vagina: Secondary | ICD-10-CM | POA: Diagnosis not present

## 2019-07-18 HISTORY — DX: Family history of malignant neoplasm of breast: Z80.3

## 2019-07-18 NOTE — Patient Instructions (Signed)
Please follow up if symptoms do not improve or as needed.   Ill let you know your test results and we will treat if you we find BV or an infection.  Avoid tampons for now.

## 2019-07-18 NOTE — Progress Notes (Signed)
Subjective  CC:  Chief Complaint  Patient presents with  . vaginal odor    Fishy odor    HPI: Shelley Williams is a 43 y.o. female who presents to the office today to address the problems listed above in the chief complaint.  Patient presents for evaluation of an abnormal vaginal odor that she noted this am during intercourse: she describes  scant clear d/c without pain, sores, itching. Has had retained tampon in past but doesn't think that is the issue. Has h/o BV but has been many months since last infection.. Sexually transmitted infection risk: Possible STD exposure. The patient denies history of sexually transmitted disease.. She declines serology testing and HIV testing today. She denies urinary sxs. She uses Tubal Ligation for birth control.  I reviewed the patients updated PMH, FH, and SocHx.    Patient Active Problem List   Diagnosis Date Noted  . Atopic dermatitis 01/11/2019  . Herpes simplex type 2 infection 01/01/2018  . Nicotine dependence, cigarettes, uncomplicated [id number]  . Pruritus 01/01/2018   Current Meds  Medication Sig  . ELDERBERRY PO Take by mouth.  Marland Kitchen OVER THE COUNTER MEDICATION [place]    Allergies: Patient has No Known Allergies. Family History: Patient family history includes Arthritis in her mother; Asthma in her mother; GER disease in her father; Glaucoma in her father; Healthy in her daughter and son; Heart disease in her maternal grandmother; Hyperlipidemia in her father; Hypertension in her father and mother; Lung cancer in her paternal grandfather; Pancreatic cancer in her father. Social History:  Patient  reports that she has been smoking cigarettes. She has been smoking about 0.50 packs per day. She has never used smokeless tobacco. She reports that she does not drink alcohol or use drugs.  Review of Systems: Constitutional: Negative for fever malaise or anorexia Cardiovascular: negative for chest pain Respiratory: negative for SOB or  persistent cough Gastrointestinal: negative for abdominal pain  Objective  Vitals: BP 90/60 (BP Location: Left Arm, Patient Position: Sitting, Cuff Size: Normal)   Pulse 85   Temp 98.2 F (36.8 C) (Temporal)   Ht 5' 4.5" (1.638 m)   Wt 119 lb (54 kg)   LMP 06/25/2019   SpO2 100%   Breastfeeding No   BMI 20.11 kg/m  General: no acute distress , A&Ox3 Cardiovascular:  RRR without murmur or gallop.  Gastrointestinal: soft, flat abdomen, normal active bowel sounds, no palpable masses, no hepatosplenomegaly, no appreciated hernias GYN: normal introitus, clear cervical secretions and clear cervix. No FB Skin:  Warm, no rashes  Assessment  1. Vaginal odor      Plan   Vaginal sxs:  Test for vaginitis. Reassured. Would avoid tampon use given h/o recurrent BV.   Follow up: prn    Commons side effects, risks, benefits, and alternatives for medications and treatment plan prescribed today were discussed, and the patient expressed understanding of the given instructions. Patient is instructed to call or message via MyChart if he/she has any questions or concerns regarding our treatment plan. No barriers to understanding were identified. We discussed Red Flag symptoms and signs in detail. Patient expressed understanding regarding what to do in case of urgent or emergency type symptoms.   Medication list was reconciled, printed and provided to the patient in AVS. Patient instructions and summary information was reviewed with the patient as documented in the AVS. This note was prepared with assistance of Dragon voice recognition software. Occasional wrong-word or sound-a-like substitutions may have occurred due to the  inherent limitations of voice recognition software  No orders of the defined types were placed in this encounter.  No orders of the defined types were placed in this encounter.

## 2019-07-19 ENCOUNTER — Encounter: Payer: Self-pay | Admitting: Family Medicine

## 2019-07-19 LAB — CERVICOVAGINAL ANCILLARY ONLY
Bacterial Vaginitis (gardnerella): POSITIVE — AB
Candida Glabrata: NEGATIVE
Candida Vaginitis: POSITIVE — AB
Chlamydia: NEGATIVE
Comment: NEGATIVE
Comment: NEGATIVE
Comment: NEGATIVE
Comment: NEGATIVE
Comment: NEGATIVE
Comment: NORMAL
Neisseria Gonorrhea: NEGATIVE
Trichomonas: NEGATIVE

## 2019-07-19 MED ORDER — FLUCONAZOLE 150 MG PO TABS: ORAL_TABLET | ORAL | 0 refills | Status: DC

## 2019-07-19 MED ORDER — METRONIDAZOLE 500 MG PO TABS: 500.0000 mg | ORAL_TABLET | Freq: Two times a day (BID) | ORAL | 0 refills | Status: AC

## 2019-07-19 NOTE — Addendum Note (Signed)
Addended by: Billey Chang on: 07/19/2019 04:48 PM   Modules accepted: Orders

## 2019-07-19 NOTE — Telephone Encounter (Signed)
Resulted but not reviewed.   Bacterial Vaginitis (gardnerella) PositiveAbnormal    Candida Vaginitis PositiveAbnormal     Forwarding to Dr. Jonni Sanger to advise.

## 2019-07-24 ENCOUNTER — Encounter: Payer: Self-pay | Admitting: Family Medicine

## 2019-07-24 ENCOUNTER — Other Ambulatory Visit: Payer: Self-pay

## 2019-07-24 ENCOUNTER — Ambulatory Visit (INDEPENDENT_AMBULATORY_CARE_PROVIDER_SITE_OTHER): Payer: BC Managed Care – PPO | Admitting: Family Medicine

## 2019-07-24 ENCOUNTER — Other Ambulatory Visit (HOSPITAL_COMMUNITY)
Admission: RE | Admit: 2019-07-24 | Discharge: 2019-07-24 | Disposition: A | Discharge status: Home / Self Care | Payer: BC Managed Care – PPO | Source: Ambulatory Visit | Attending: Family Medicine | Admitting: Family Medicine

## 2019-07-24 VITALS — BP 110/68 | HR 71 | Temp 98.2°F | Ht 64.5 in | Wt 122.0 lb

## 2019-07-24 DIAGNOSIS — Z202 Contact with and (suspected) exposure to infections with a predominantly sexual mode of transmission: Secondary | ICD-10-CM | POA: Insufficient documentation

## 2019-07-24 NOTE — Progress Notes (Signed)
Subjective  CC: STD exposure  HPI: Shelley Williams is a 43 y.o. female who presents to the office today to address the problems listed above in the chief complaint.  Her current partner reports that his recent ex-girlfriend told him yesterday that she has syphyllis. Pt would like to be screened. She has had unprotected intercourse with her boyfriend; neither have sxs of std.   Pt was recently treated for BV and yeast. sxs have resolved.    Assessment  1. STD exposure      Plan   STD/syhphyllis exposure:  Screen for hiv, RPR, GC/chl. Safe sex education reinforced.   Follow up: prn  Visit date not found  No orders of the defined types were placed in this encounter.  No orders of the defined types were placed in this encounter.     I reviewed the patients updated PMH, FH, and SocHx.    Patient Active Problem List   Diagnosis Date Noted  . Family history of breast cancer in mother 07/18/2019  . Atopic dermatitis 01/11/2019  . Herpes simplex type 2 infection 01/01/2018  . Nicotine dependence, cigarettes, uncomplicated Q000111Q  . Pruritus 01/01/2018   Current Meds  Medication Sig  . ELDERBERRY PO Take by mouth.  . fluconazole (DIFLUCAN) 150 MG tablet Take one tablet today; may repeat in 3 days if symptoms persist  . metroNIDAZOLE (FLAGYL) 500 MG tablet Take 1 tablet (500 mg total) by mouth 2 (two) times daily for 7 days.  Marland Kitchen OVER THE COUNTER MEDICATION [place]    Allergies: Patient has No Known Allergies. Family History: Patient family history includes Arthritis in her mother; Asthma in her mother; Breast cancer in her mother; GER disease in her father; Glaucoma in her father; Healthy in her daughter and son; Heart disease in her maternal grandmother; Hyperlipidemia in her father; Hypertension in her father and mother; Lung cancer in her paternal grandfather; Pancreatic cancer in her father. Social History:  Patient  reports that she has been smoking cigarettes. She has  been smoking about 0.50 packs per day. She has never used smokeless tobacco. She reports that she does not drink alcohol or use drugs.  Review of Systems: Constitutional: Negative for fever malaise or anorexia Cardiovascular: negative for chest pain Respiratory: negative for SOB or persistent cough Gastrointestinal: negative for abdominal pain  Objective  Vitals: BP 110/68 (BP Location: Left Arm, Patient Position: Sitting, Cuff Size: Normal)   Pulse 71   Temp 98.2 F (36.8 C) (Temporal)   Ht 5' 4.5" (1.638 m)   Wt 122 lb (55.3 kg)   LMP 06/25/2019   SpO2 100%   BMI 20.62 kg/m  General: no acute distress , A&Ox3  No visits with results within 1 Day(s) from this visit.  Latest known visit with results is:  Office Visit on 07/18/2019  Component Date Value Ref Range Status  . Neisseria Gonorrhea 07/18/2019 Negative   Final  . Chlamydia 07/18/2019 Negative   Final  . Trichomonas 07/18/2019 Negative   Final  . Bacterial Vaginitis (gardnerella) 07/18/2019 Positive*  Final  . Candida Vaginitis 07/18/2019 Positive*  Final  . Candida Glabrata 07/18/2019 Negative   Final  . Comment 07/18/2019 Normal Reference Range Bacterial Vaginosis - Negative   Final  . Comment 07/18/2019 Normal Reference Range Candida Species - Negative   Final  . Comment 07/18/2019 Normal Reference Range Candida Galbrata - Negative   Final  . Comment 07/18/2019 Normal Reference Range Trichomonas - Negative   Final  . Comment  07/18/2019 Normal Reference Ranger Chlamydia - Negative   Final  . Comment 07/18/2019 Normal Reference Range Neisseria Gonorrhea - Negative   Final      Commons side effects, risks, benefits, and alternatives for medications and treatment plan prescribed today were discussed, and the patient expressed understanding of the given instructions. Patient is instructed to call or message via MyChart if he/she has any questions or concerns regarding our treatment plan. No barriers to understanding  were identified. We discussed Red Flag symptoms and signs in detail. Patient expressed understanding regarding what to do in case of urgent or emergency type symptoms.   Medication list was reconciled, printed and provided to the patient in AVS. Patient instructions and summary information was reviewed with the patient as documented in the AVS. This note was prepared with assistance of Dragon voice recognition software. Occasional wrong-word or sound-a-like substitutions may have occurred due to the inherent limitations of voice recognition software

## 2019-07-24 NOTE — Patient Instructions (Signed)
Please follow up if symptoms do not improve or as needed.   I will release your lab results to you on your MyChart account with further instructions. Please reply with any questions.   

## 2019-07-25 LAB — RPR: RPR Ser Ql: NONREACTIVE

## 2019-07-25 LAB — HIV ANTIBODY (ROUTINE TESTING W REFLEX): HIV 1&2 Ab, 4th Generation: NONREACTIVE

## 2019-07-26 LAB — URINE CYTOLOGY ANCILLARY ONLY
Chlamydia: NEGATIVE
Comment: NEGATIVE
Comment: NORMAL
Neisseria Gonorrhea: NEGATIVE

## 2019-10-03 ENCOUNTER — Encounter: Payer: Self-pay | Admitting: Family Medicine

## 2019-10-04 NOTE — Telephone Encounter (Signed)
Please call for virtual or if no symptoms in office visit

## 2019-10-04 NOTE — Telephone Encounter (Signed)
Called and scheduled virtual with Shelley Williams

## 2019-10-07 ENCOUNTER — Encounter: Payer: Self-pay | Admitting: Physician Assistant

## 2019-10-07 ENCOUNTER — Ambulatory Visit (INDEPENDENT_AMBULATORY_CARE_PROVIDER_SITE_OTHER): Payer: BC Managed Care – PPO | Admitting: Physician Assistant

## 2019-10-07 VITALS — Ht 64.5 in | Wt 125.0 lb

## 2019-10-07 DIAGNOSIS — R21 Rash and other nonspecific skin eruption: Secondary | ICD-10-CM | POA: Diagnosis not present

## 2019-10-07 MED ORDER — FLUTICASONE PROPIONATE 0.005 % EX OINT: TOPICAL_OINTMENT | CUTANEOUS | 0 refills | Status: DC

## 2019-10-07 NOTE — Progress Notes (Signed)
Virtual Visit via Video   I connected with Shelley Williams on 10/07/19 at  4:00 PM EST by a video enabled telemedicine application and verified that I am speaking with the correct person using two identifiers. Location patient: Home Location provider: [facility], Office Persons participating in the virtual visit: Shelley Williams, Inda Coke PA-C, Shelley Pickler, LPN   I discussed the limitations of evaluation and management by telemedicine and the availability of in person appointments. The patient expressed understanding and agreed to proceed.  I acted as a Education administrator for [employer], [employer], LPN  Subjective:   HPI:   Insect bite Pt c/o bite on left hand, noticed last week was itching, is not now. The area was red and swollen but has gone down since that time. Denies drainage from site. Denies fever, pain or chills. Pt has been using anti-itch gel with relief. Has significant hx of eczema. Uses triamcinolone 0.1% on her body when needed -- has not tried this for this area. Area is improving with time.  ROS: See pertinent positives and negatives per HPI.  Patient Active Problem List   Diagnosis Date Noted  . Family history of breast cancer in mother 07/18/2019  . Atopic dermatitis 01/11/2019  . Herpes simplex type 2 infection 01/01/2018  . Nicotine dependence, cigarettes, uncomplicated Q000111Q  . Pruritus 01/01/2018    Social History   Tobacco Use  . Smoking status: Current Every Day Smoker    Packs/day: 0.50    Types: Cigarettes  . Smokeless tobacco: Never Used  Substance Use Topics  . Alcohol use: No    Current Outpatient Medications:  .  ELDERBERRY PO, Take by mouth., Disp: , Rfl:  .  OVER THE COUNTER MEDICATION, [place], Disp: , Rfl:  .  triamcinolone cream (KENALOG) 0.1 %, Apply 1 application topically 2 (two) times daily., Disp: , Rfl:  .  fluticasone (CUTIVATE) 0.005 % ointment, Apply to affected area 1-2 times daily, Disp: 30 g, Rfl: 0  No  Known Allergies  Objective:   VITALS: Per patient if applicable, see vitals. GENERAL: Alert, appears well and in no acute distress. HEENT: Atraumatic, conjunctiva clear, no obvious abnormalities on inspection of external nose and ears. NECK: Normal movements of the head and neck. CARDIOPULMONARY: No increased WOB. Speaking in clear sentences. I:E ratio WNL.  MS: Moves all visible extremities without noticeable abnormality. PSYCH: Pleasant and cooperative, well-groomed. Speech normal rate and rhythm. Affect is appropriate. Insight and judgement are appropriate. Attention is focused, linear, and appropriate.  NEURO: CN grossly intact. Oriented as arrived to appointment on time with no prompting. Moves both UE equally.  SKIN: Small area of slightly hypopigmented area to base of L thumb with evidence of broken skin; no erythema      Assessment and Plan:   Gizele was seen today for insect bite.  Diagnoses and all orders for this visit:  Rash Area is improving with time. No red flags on discussion, does not appear to be infected. Recommended using the triamcinolone ointment that she already has on this area, and keep [place] posted on symptoms. She has also requested refill on her fluticasone ointment, which I have also sent. Follow-up if no improvement or any worsening of symptoms.  Other orders -     fluticasone (CUTIVATE) 0.005 % ointment; Apply to affected area 1-2 times daily    . Reviewed expectations re: course of current medical issues. . Discussed self-management of symptoms. . Outlined signs and symptoms indicating need for  more acute intervention. . Patient verbalized understanding and all questions were answered. Marland Kitchen Health Maintenance issues including appropriate healthy diet, exercise, and smoking avoidance were discussed with patient. . See orders for this visit as documented in the electronic medical record.  I discussed the assessment and treatment plan with the patient.  The patient was provided an opportunity to ask questions and all were answered. The patient agreed with the plan and demonstrated an understanding of the instructions.   The patient was advised to call back or seek an in-person evaluation if the symptoms worsen or if the condition fails to improve as anticipated.   CMA or LPN served as scribe during this visit. History, Physical, and Plan performed by medical provider. The above documentation has been reviewed and is accurate and complete.   [place], [place] 10/07/2019

## 2019-10-14 ENCOUNTER — Ambulatory Visit: Payer: BC Managed Care – PPO | Attending: Internal Medicine

## 2019-10-14 DIAGNOSIS — Z20822 Contact with and (suspected) exposure to covid-19: Secondary | ICD-10-CM

## 2019-10-15 ENCOUNTER — Ambulatory Visit (INDEPENDENT_AMBULATORY_CARE_PROVIDER_SITE_OTHER): Payer: BC Managed Care – PPO | Admitting: Family Medicine

## 2019-10-15 ENCOUNTER — Other Ambulatory Visit: Payer: Self-pay

## 2019-10-15 ENCOUNTER — Encounter: Payer: Self-pay | Admitting: Family Medicine

## 2019-10-15 VITALS — Ht 64.5 in | Wt 125.0 lb

## 2019-10-15 DIAGNOSIS — L2084 Intrinsic (allergic) eczema: Secondary | ICD-10-CM | POA: Diagnosis not present

## 2019-10-15 LAB — NOVEL CORONAVIRUS, NAA: SARS-CoV-2, NAA: NOT DETECTED

## 2019-10-15 NOTE — Progress Notes (Signed)
Virtual Visit via Video Note  Subjective  CC:  Chief Complaint  Patient presents with  . Insect Bite    left hand itches. brought wood in house week before last. covid test came back neg. no infection or fevers     I connected with Park Liter on 10/15/19 at  4:20 PM EST by a video enabled telemedicine application and verified that I am speaking with the correct person using two identifiers. Location patient: Home Location provider: [facility] [place], Office Persons participating in the virtual visit: Shelley Williams, Leamon Arnt, MD Serita Sheller, [place] discussed the limitations of evaluation and management by telemedicine and the availability of in person appointments. The patient expressed understanding and agreed to proceed. HPI: Shelley Williams is a 44 y.o. female who was contacted today to address the problems listed above in the chief complaint. . Pt w/ chronic eczema/atopic derm and intermittent itching w/o rash on right palm. I reviewed note from a few weeks ago for same. She is on temovate which stops the itching but the flaking rash persists. She worries it is an insect bite. No pain. No systemic sxs. Has h/o atopic derm w/ hypopigmented rash. Has been evaluated by derm in past who confirmed dx. No new sxs. No new exposures.  Assessment  1. Intrinsic atopic dermatitis      Plan   eczema:  Reassured. No red flag sxs. Inconsistent with spider bite. Continue steroid cream as needed and avoid triggers: hand sanitizers etc. Use moisturizer as well. F/u as needed.  I discussed the assessment and treatment plan with the patient. The patient was provided an opportunity to ask questions and all were answered. The patient agreed with the plan and demonstrated an understanding of the instructions.   The patient was advised to call back or seek an in-person evaluation if the symptoms worsen or if the condition fails to improve as anticipated. Follow up: as  scheduled:  04/06/2020  No orders of the defined types were placed in this encounter.     I reviewed the patients updated PMH, FH, and SocHx.    Patient Active Problem List   Diagnosis Date Noted  . Family history of breast cancer in mother 07/18/2019  . Atopic dermatitis 01/11/2019  . Herpes simplex type 2 infection 01/01/2018  . Nicotine dependence, cigarettes, uncomplicated Q000111Q  . Pruritus 01/01/2018   Current Meds  Medication Sig  . ELDERBERRY PO Take by mouth.  . fluticasone (CUTIVATE) 0.005 % ointment Apply to affected area 1-2 times daily  . OVER THE COUNTER MEDICATION [place]  . triamcinolone cream (KENALOG) 0.1 % Apply 1 application topically 2 (two) times daily.    Allergies: Patient has No Known Allergies. Family History: Patient family history includes Arthritis in her mother; Asthma in her mother; Breast cancer in her mother; GER disease in her father; Glaucoma in her father; Healthy in her daughter and son; Heart disease in her maternal grandmother; Hyperlipidemia in her father; Hypertension in her father and mother; Lung cancer in her paternal grandfather; Pancreatic cancer in her father. Social History:  Patient  reports that she has been smoking cigarettes. She has been smoking about 0.50 packs per day. She has never used smokeless tobacco. She reports that she does not drink alcohol or use drugs.  Review of Systems: Constitutional: Negative for fever malaise or anorexia Cardiovascular: negative for chest pain Respiratory: negative for SOB or persistent cough Gastrointestinal: negative for abdominal pain  OBJECTIVE Vitals: Ht 5' 4.5" (1.638 m)   Wt 125 lb (56.7 kg)   LMP 09/30/2019 (Approximate)   BMI 21.12 kg/m  General: no acute distress , A&Ox3 Palm of left hand with annular hypopigemented are about 3cm with flaking. No redness. No papules. No vesicles. No necrosis  Leamon Arnt, MD

## 2019-10-22 ENCOUNTER — Encounter: Payer: Self-pay | Admitting: Family Medicine

## 2020-01-24 ENCOUNTER — Other Ambulatory Visit: Payer: Self-pay | Admitting: Physician Assistant

## 2020-01-30 ENCOUNTER — Other Ambulatory Visit: Payer: Self-pay | Admitting: Physician Assistant

## 2020-04-06 ENCOUNTER — Other Ambulatory Visit: Payer: Self-pay

## 2020-04-06 ENCOUNTER — Encounter: Payer: Self-pay | Admitting: Family Medicine

## 2020-04-06 ENCOUNTER — Ambulatory Visit (INDEPENDENT_AMBULATORY_CARE_PROVIDER_SITE_OTHER): Payer: BC Managed Care – PPO | Admitting: Family Medicine

## 2020-04-06 VITALS — BP 112/68 | HR 80 | Temp 98.0°F | Resp 15 | Ht 63.5 in | Wt 124.8 lb

## 2020-04-06 DIAGNOSIS — L2084 Intrinsic (allergic) eczema: Secondary | ICD-10-CM

## 2020-04-06 DIAGNOSIS — Z0000 Encounter for general adult medical examination without abnormal findings: Secondary | ICD-10-CM

## 2020-04-06 DIAGNOSIS — Z803 Family history of malignant neoplasm of breast: Secondary | ICD-10-CM | POA: Diagnosis not present

## 2020-04-06 DIAGNOSIS — B009 Herpesviral infection, unspecified: Secondary | ICD-10-CM

## 2020-04-06 DIAGNOSIS — F17210 Nicotine dependence, cigarettes, uncomplicated: Secondary | ICD-10-CM

## 2020-04-06 MED ORDER — TRIAMCINOLONE ACETONIDE 0.1 % EX CREA: 1.0000 "application " | TOPICAL_CREAM | Freq: Two times a day (BID) | CUTANEOUS | 2 refills | Status: DC | PRN

## 2020-04-06 NOTE — Patient Instructions (Signed)
Please return in 12 months for your annual complete physical; please come fasting.  I will release your lab results to you on your MyChart account with further instructions. Please reply with any questions.    If you have any questions or concerns, please don't hesitate to send me a message via MyChart or call the office at [phone]. Thank you for visiting with [place] today! It's our pleasure caring for you.  Please do these things to maintain good health!   Exercise at least 30-45 minutes a day,  4-5 days a week.   Eat a low-fat diet with lots of fruits and vegetables, up to 7-9 servings per day.  Drink plenty of water daily. Try to drink 8 8oz glasses per day.  Seatbelts can save your life. Always wear your seatbelt.  Place Smoke Detectors on every level of your home and check batteries every year.  Schedule an appointment with an eye doctor for an eye exam every 1-2 years  Safe sex - use condoms to protect yourself from STDs if you could be exposed to these types of infections. Use birth control if you do not want to become pregnant and are sexually active.  Avoid heavy alcohol use. If you drink, keep it to less than 2 drinks/day and not every day.  [place].  Choose someone you trust that could speak for you if you became unable to speak for yourself.  Depression is common in our stressful world.If you're feeling down or losing interest in things you normally enjoy, please come in for a visit.  If anyone is threatening or hurting you, please get help. Physical or Emotional Violence is never OK.

## 2020-04-06 NOTE — Progress Notes (Signed)
Subjective  Chief Complaint  Patient presents with  . Annual Exam    non-fasting, needing refill on eczema cream    HPI: Shelley Williams is a 44 y.o. female who presents to [facility] [place] today for a Female Wellness Visit.  She also has the concerns and/or needs as listed above in the chief complaint. These will be addressed in addition to the Health Maintenance Visit.   Wellness Visit: annual visit with health maintenance review and exam without Pap   HM: doing well. Healthy lifestyle. No concerns. Smokes 1-2 cigs/day. Doesn't want to quit. Declines covid vaccine.  Chronic disease management visit and/or acute problem visit:  Eczema: prn triamcinolone cream needs refill.   Assessment  1. Annual physical exam   2. Herpes simplex type 2 infection   3. Family history of breast cancer in mother   48. Intrinsic atopic dermatitis   5. Nicotine dependence, cigarettes, uncomplicated      Plan  Female Wellness Visit:  Age appropriate Health Maintenance and Prevention measures were discussed with patient. Included topics are cancer screening recommendations, ways to keep healthy (see AVS) including dietary and exercise recommendations, regular eye and dental care, use of seat belts, and avoidance of moderate alcohol use and tobacco use. Refuses complete cessation of cigs; declines covid vaccination; education and counseling given.   BMI: discussed patient's BMI and encouraged positive lifestyle modifications to help get to or maintain a target BMI.  HM needs and immunizations were addressed and ordered. See below for orders. See HM and immunization section for updates.  Routine labs and screening tests ordered including cmp, cbc and lipids where appropriate.  Discussed recommendations regarding Vit D and calcium supplementation (see AVS)  Chronic disease f/u and/or acute problem visit: (deemed necessary to be done in addition to the wellness visit):  Refilled  steroid cream for eczema.  Follow up: Return in about 1 year (around 04/06/2021) for complete physical.   Orders Placed This Encounter  Procedures  . Hepatitis C antibody  . CBC with Differential/Platelet  . Comprehensive metabolic panel  . Lipid panel   Meds ordered this encounter  Medications  . triamcinolone cream (KENALOG) 0.1 %    Sig: Apply 1 application topically 2 (two) times daily as needed.    Dispense:  45 g    Refill:  2      Lifestyle: Body mass index is 21.76 kg/m. Wt Readings from Last 3 Encounters:  04/06/20 124 lb 12.8 oz (56.6 kg)  10/15/19 125 lb (56.7 kg)  10/07/19 125 lb (56.7 kg)    Diet: low fat Exercise: frequently,  Need for contraception: No, tubal ligation  Patient Active Problem List   Diagnosis Date Noted  . Family history of breast cancer in mother 07/18/2019    Mom age 66, 14   . Atopic dermatitis 01/11/2019  . Herpes simplex type 2 infection 01/01/2018    Rare outbreak     . Nicotine dependence, cigarettes, uncomplicated 41/32/4401  . Pruritus 01/01/2018   Health Maintenance  Topic Date Due  . Hepatitis C Screening  Never done  . INFLUENZA VACCINE  04/05/2020  . COVID-19 Vaccine (1) 04/22/2020 (Originally 07/06/1988)  . PAP SMEAR-Modifier  01/02/2023  . TETANUS/TDAP  11/17/2024  . HIV Screening  Completed   Immunization History  Administered Date(s) Administered  . Hepatitis B, ped/adol 05/30/2014  . Influenza, Seasonal, Injecte, Preservative Fre 06/05/2016  . Influenza,inj,quad, With Preservative 06/05/2014  . Tdap 09/05/2010, 06/03/2011, 11/18/2014  We updated and reviewed the patient's past history in detail and it is documented below. Allergies: Patient  reports no history of alcohol use. Past Medical History Patient  has a past medical history of Benign gestational thrombocytopenia ([place]) (01/01/2018), Family history of breast cancer in mother (07/18/2019), HSV (herpes simplex virus) infection, and Postpartum  depression (01/01/2018). Past Surgical History Patient  has a past surgical history that includes Wisdom tooth extraction and Tubal ligation (Bilateral, 02/01/2015). Social History   Socioeconomic History  . Marital status: Single    Spouse name: Not on file  . Number of children: 2  . Years of education: Not on file  . Highest education level: Not on file  Occupational History  . 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 Smoking status: Current Every Day Smoker    Packs/day: 0.50    Types: Cigarettes  . Smokeless tobacco: Never Used  Vaping Use  . Vaping Use: Never used  Substance and Sexual Activity  . Alcohol use: No  . Drug use: No  . Sexual activity: Not Currently    Birth control/protection: Surgical  Other Topics Concern  . Not on file  Social History Narrative  . Not on file   Social Determinants of Health   Financial Resource Strain:   . Difficulty of Paying Living Expenses:   Food Insecurity:   . Worried About Charity fundraiser in the Last Year:   . Arboriculturist in the Last Year:   Transportation Needs:   . Film/video editor (Medical):   Marland Kitchen Lack of Transportation (Non-Medical):   Physical Activity:   . Days of Exercise per Week:   . Minutes of Exercise per Session:   Stress:   . Feeling of Stress :   Social Connections:   . Frequency of Communication with Friends and Family:   . Frequency of Social Gatherings with Friends and Family:   . Attends Religious Services:   . Active Member of Clubs or Organizations:   . Attends Archivist Meetings:   Marland Kitchen Marital Status:    Family History  Problem Relation Age of Onset  . Asthma Mother   . Hypertension Mother   . Arthritis Mother   . Breast cancer Mother   . Hypertension Father   . GER disease Father   . Glaucoma Father   . Hyperlipidemia Father   . Pancreatic cancer Father   . Heart disease Maternal Grandmother   . Lung cancer Paternal  Grandfather   . Healthy Daughter   . Healthy Son     Review of Systems: Constitutional: negative for fever or malaise Ophthalmic: negative for photophobia, double vision or loss of vision Cardiovascular: negative for chest pain, dyspnea on exertion, or new LE swelling Respiratory: negative for SOB or persistent cough Gastrointestinal: negative for abdominal pain, change in bowel habits or melena Genitourinary: negative for dysuria or gross hematuria, no abnormal uterine bleeding or disharge Musculoskeletal: negative for new gait disturbance or muscular weakness Integumentary: negative for new or persistent rashes, no breast lumps Neurological: negative for TIA or stroke symptoms Psychiatric: negative for SI or delusions Allergic/Immunologic: negative for hives  Patient Care Team    Relationship Specialty Notifications Start End  Leamon Arnt, MD PCP - General Family Medicine  01/01/18     Objective  Vitals: BP 112/68   Pulse 80   Temp 98 F (36.7 C) (Temporal)   Resp 15   Ht  5' 3.5" (1.613 m)   Wt 124 lb 12.8 oz (56.6 kg)   SpO2 99%   BMI 21.76 kg/m  General:  Well developed, well nourished, no acute distress  Psych:  Alert and orientedx3,normal mood and affect HEENT:  Normocephalic, atraumatic, non-icteric sclera, PERRL, supple neck without adenopathy, mass or thyromegaly Cardiovascular:  Normal S1, S2, RRR without gallop, rub or murmur Respiratory:  Good breath sounds bilaterally, CTAB with normal respiratory effort Gastrointestinal: normal bowel sounds, soft, non-tender, no noted masses. No HSM MSK: no deformities, contusions. Joints are without erythema or swelling.  Skin:  Warm, no rashes or suspicious lesions noted Neurologic:    Mental status is normal. Gross motor and sensory exams are normal. Normal gait. No tremor Breast Exam: No mass, skin retraction or nipple discharge is appreciated in either breast. No axillary adenopathy. Fibrocystic changes are not  noted     Commons side effects, risks, benefits, and alternatives for medications and treatment plan prescribed today were discussed, and the patient expressed understanding of the given instructions. Patient is instructed to call or message via MyChart if he/she has any questions or concerns regarding our treatment plan. No barriers to understanding were identified. We discussed Red Flag symptoms and signs in detail. Patient expressed understanding regarding what to do in case of urgent or emergency type symptoms.   Medication list was reconciled, printed and provided to the patient in AVS. Patient instructions and summary information was reviewed with the patient as documented in the AVS. This note was prepared with assistance of Dragon voice recognition software. Occasional wrong-word or sound-a-like substitutions may have occurred due to the inherent limitations of voice recognition software  This visit occurred during the SARS-CoV-2 public health emergency.  Safety protocols were in place, including screening questions prior to the visit, additional usage of staff PPE, and extensive cleaning of exam room while observing appropriate contact time as indicated for disinfecting solutions.

## 2020-04-06 NOTE — Addendum Note (Signed)
Addended by: Liliane Channel on: 04/06/2020 12:09 PM   Modules accepted: Orders

## 2020-04-07 NOTE — Progress Notes (Signed)
Please add on folate and vit b12, dx: macrocytic anemia.  Thanks, Dr. Jonni Sanger ' Joellen [place], who is the lab person to send this to?)

## 2020-04-09 LAB — CBC WITH DIFFERENTIAL/PLATELET
Absolute Monocytes: 505 cells/uL (ref 200–950)
Basophils Absolute: 51 cells/uL (ref 0–200)
Basophils Relative: 1 %
Eosinophils Absolute: 444 cells/uL (ref 15–500)
Eosinophils Relative: 8.7 %
HCT: 37.9 % (ref 35.0–45.0)
Hemoglobin: 12.7 g/dL (ref 11.7–15.5)
Lymphs Abs: 1790 cells/uL (ref 850–3900)
MCH: 34 pg — ABNORMAL HIGH (ref 27.0–33.0)
MCHC: 33.5 g/dL (ref 32.0–36.0)
MCV: 101.3 fL — ABNORMAL HIGH (ref 80.0–100.0)
MPV: 11.9 fL (ref 7.5–12.5)
Monocytes Relative: 9.9 %
Neutro Abs: 2310 cells/uL (ref 1500–7800)
Neutrophils Relative %: 45.3 %
Platelets: 266 10*3/uL (ref 140–400)
RBC: 3.74 10*6/uL — ABNORMAL LOW (ref 3.80–5.10)
RDW: 11.8 % (ref 11.0–15.0)
Total Lymphocyte: 35.1 %
WBC: 5.1 10*3/uL (ref 3.8–10.8)

## 2020-04-09 LAB — COMPREHENSIVE METABOLIC PANEL
AG Ratio: 1.6 (calc) (ref 1.0–2.5)
ALT: 14 U/L (ref 6–29)
AST: 21 U/L (ref 10–30)
Albumin: 4.2 g/dL (ref 3.6–5.1)
Alkaline phosphatase (APISO): 42 U/L (ref 31–125)
BUN: 10 mg/dL (ref 7–25)
CO2: 26 mmol/L (ref 20–32)
Calcium: 9.4 mg/dL (ref 8.6–10.2)
Chloride: 105 mmol/L (ref 98–110)
Creat: 0.75 mg/dL (ref 0.50–1.10)
Globulin: 2.6 g/dL (calc) (ref 1.9–3.7)
Glucose, Bld: 88 mg/dL (ref 65–99)
Potassium: 4.4 mmol/L (ref 3.5–5.3)
Sodium: 138 mmol/L (ref 135–146)
Total Bilirubin: 0.4 mg/dL (ref 0.2–1.2)
Total Protein: 6.8 g/dL (ref 6.1–8.1)

## 2020-04-09 LAB — LIPID PANEL
Cholesterol: 216 mg/dL — ABNORMAL HIGH (ref <200)
HDL: 82 mg/dL (ref >=50)
LDL Cholesterol (Calc): 120 mg/dL (calc) — ABNORMAL HIGH
Non-HDL Cholesterol (Calc): 134 mg/dL (calc) — ABNORMAL HIGH (ref <130)
Total CHOL/HDL Ratio: 2.6 (calc) (ref <5.0)
Triglycerides: 46 mg/dL (ref <150)

## 2020-04-09 LAB — HEPATITIS C ANTIBODY
Hepatitis C Ab: NONREACTIVE
SIGNAL TO CUT-OFF: 0.01 (ref <1.00)

## 2020-04-09 LAB — B12 AND FOLATE PANEL
Folate: 9.9 ng/mL
Vitamin B-12: 472 pg/mL (ref 200–1100)

## 2020-06-28 ENCOUNTER — Encounter: Payer: Self-pay | Admitting: Family Medicine

## 2020-07-01 ENCOUNTER — Ambulatory Visit: Payer: BC Managed Care – PPO | Admitting: Physician Assistant

## 2020-07-01 ENCOUNTER — Ambulatory Visit (INDEPENDENT_AMBULATORY_CARE_PROVIDER_SITE_OTHER): Payer: BC Managed Care – PPO | Admitting: Physician Assistant

## 2020-07-01 ENCOUNTER — Other Ambulatory Visit (HOSPITAL_COMMUNITY)
Admission: RE | Admit: 2020-07-01 | Discharge: 2020-07-01 | Disposition: A | Discharge status: Home / Self Care | Payer: BC Managed Care – PPO | Source: Ambulatory Visit | Attending: Physician Assistant | Admitting: Physician Assistant

## 2020-07-01 ENCOUNTER — Encounter: Payer: Self-pay | Admitting: Physician Assistant

## 2020-07-01 VITALS — BP 102/70 | HR 76 | Temp 98.2°F | Ht 63.5 in | Wt 125.0 lb

## 2020-07-01 DIAGNOSIS — B373 Candidiasis of vulva and vagina: Secondary | ICD-10-CM | POA: Insufficient documentation

## 2020-07-01 DIAGNOSIS — N898 Other specified noninflammatory disorders of vagina: Secondary | ICD-10-CM | POA: Diagnosis not present

## 2020-07-01 MED ORDER — METRONIDAZOLE 500 MG PO TABS
500.0000 mg | ORAL_TABLET | Freq: Two times a day (BID) | ORAL | 0 refills | Status: DC
Start: 2020-07-01 — End: 2020-11-09

## 2020-07-01 NOTE — Patient Instructions (Signed)
It was great to see you!  I have sent in the flagyl for you to take if you do have BV. I will let you know if this is positive through mychart. This antibiotic DOES NOT MIX WELL with alcohol.  Vaginal Hygiene Healthy practices for vaginal hygiene - Avoid pajamas. A robe / nightgown allows better air circulation. Sleep without panties / panties whenever possible. - Wear cotton panties / panties during the day. After washing the panties / panties, rinse them twice to avoid irritating residues. Do not use fabric softeners for panties and / or swimsuits. - Avoid tights, leotard, leggings, tight pants or other tight attire. Loose skirts and pants allow air to circulate. - Avoid the pantiprotector. Better use tampons or sanitary pads. It is beneficial to bathe with warm water every day: - Soak in clean water (without soap) for 10 to 15 minutes. It has not been specifically studied that adding vinegar or baking soda / baking soda to water is of benefit and may not be better than water alone. - Use soap to wash the other regions apart from the genital area before leaving the tub. Limit the use of soap in the genital areas. Use soaps without fragrance. - Rinse the genital area and pat dry. A hair dryer can only be used if cold air is used. - Do not use bubble bath or fragrant soap. - Do not use vaginal sprays or talcum powder. These contain chemicals that irritate the skin. - If the genital area is sore or swollen, fragrance-free disposable wipes can be used instead of toilet paper. - Emollients, such as Vaseline may help protect the skin and may be applied to the irritated area. - Always remember to clean yourself from front to back after bowel movements. Pat dry after urinating. - Don't keep the wet swimsuit on for a long time after swimming.  Take care,  Inda Coke PA-C

## 2020-07-01 NOTE — Progress Notes (Deleted)
Shelley Williams is a 44 y.o. female here for a {New prob or follow up:31724}.  SCRIBE STATEMENT  History of Present Illness:   No chief complaint on file.   HPI Vaginal discharge    Past Medical History:  Diagnosis Date  . Benign gestational thrombocytopenia ([place]) 01/01/2018  . Family history of breast cancer in mother 07/18/2019  . HSV (herpes simplex virus) infection   . Postpartum depression 01/01/2018     Social History   Tobacco Use  . Smoking status: Current Every Day Smoker    Packs/day: 0.50    Types: Cigarettes  . Smokeless tobacco: Never Used  Vaping Use  . Vaping Use: Never used  Substance Use Topics  . Alcohol use: No  . Drug use: No    Past Surgical History:  Procedure Laterality Date  . TUBAL LIGATION Bilateral 02/01/2015   Procedure: POST PARTUM TUBAL LIGATION;  Surgeon: [name], MD;  Location: [facility];  Service: Gynecology;  Laterality: Bilateral;  . WISDOM TOOTH EXTRACTION      Family History  Problem Relation Age of Onset  . Asthma Mother   . Hypertension Mother   . Arthritis Mother   . Breast cancer Mother   . Hypertension Father   . GER disease Father   . Glaucoma Father   . Hyperlipidemia Father   . Pancreatic cancer Father   . Heart disease Maternal Grandmother   . Lung cancer Paternal Grandfather   . Healthy Daughter   . Healthy Son     No Known Allergies  Current Medications:   Current Outpatient Medications:  .  ELDERBERRY PO, Take by mouth., Disp: , Rfl:  .  OVER THE COUNTER MEDICATION, Sea-moss, Disp: , Rfl:  .  triamcinolone cream (KENALOG) 0.1 %, Apply 1 application topically 2 (two) times daily as needed., Disp: 45 g, Rfl: 2   Review of Systems:   ROS  Vitals:   There were no vitals filed for this visit.   There is no height or weight on file to calculate BMI.  Physical Exam:   Physical Exam  Results for orders placed or performed in visit on 04/06/20  CBC with Differential/Platelet  Result Value Ref Range    WBC 5.1 3.8 - 10.8 Thousand/uL   RBC 3.74 (L) 3.80 - 5.10 Million/uL   Hemoglobin 12.7 11.7 - 15.5 g/dL   HCT 37.9 35 - 45 %   MCV 101.3 (H) 80.0 - 100.0 fL   MCH 34.0 (H) 27.0 - 33.0 pg   MCHC 33.5 32.0 - 36.0 g/dL   RDW 11.8 11.0 - 15.0 %   Platelets 266 140 - 400 Thousand/uL   MPV 11.9 7.5 - 12.5 fL   Neutro Abs 2,310 1,500 - 7,800 cells/uL   Lymphs Abs 1,790 850 - 3,900 cells/uL   Absolute Monocytes 505 200 - 950 cells/uL   Eosinophils Absolute 444 15.0 - 500.0 cells/uL   Basophils Absolute 51 0.0 - 200.0 cells/uL   Neutrophils Relative % 45.3 %   Total Lymphocyte 35.1 %   Monocytes Relative 9.9 %   Eosinophils Relative 8.7 %   Basophils Relative 1.0 %  Comprehensive metabolic panel  Result Value Ref Range   Glucose, Bld 88 65 - 99 mg/dL   BUN 10 7 - 25 mg/dL   Creat 0.75 0.50 - 1.10 mg/dL   BUN/Creatinine Ratio NOT APPLICABLE 6 - 22 (calc)   Sodium 138 135 - 146 mmol/L   Potassium 4.4 3.5 - 5.3 mmol/L  Chloride 105 98 - 110 mmol/L   CO2 26 20 - 32 mmol/L   Calcium 9.4 8.6 - 10.2 mg/dL   Total Protein 6.8 6.1 - 8.1 g/dL   Albumin 4.2 3.6 - 5.1 g/dL   Globulin 2.6 1.9 - 3.7 g/dL (calc)   AG Ratio 1.6 1.0 - 2.5 (calc)   Total Bilirubin 0.4 0.2 - 1.2 mg/dL   Alkaline phosphatase (APISO) 42 31 - 125 U/L   AST 21 10 - 30 U/L   ALT 14 6 - 29 U/L  Hepatitis C antibody  Result Value Ref Range   Hepatitis C Ab NON-REACTIVE NON-REACTI   SIGNAL TO CUT-OFF 0.01 <1.00  Lipid panel  Result Value Ref Range   Cholesterol 216 (H) <200 mg/dL   HDL 82 > OR = 50 mg/dL   Triglycerides 46 <150 mg/dL   LDL Cholesterol (Calc) 120 (H) mg/dL (calc)   Total CHOL/HDL Ratio 2.6 <5.0 (calc)   Non-HDL Cholesterol (Calc) 134 (H) <130 mg/dL (calc)  B12 and Folate Panel  Result Value Ref Range   Vitamin B-12 472 200 - 1,100 pg/mL   Folate 9.9 ng/mL    Assessment and Plan:   There are no diagnoses linked to this encounter.  . Reviewed expectations re: course of current medical  issues. . Discussed self-management of symptoms. . Outlined signs and symptoms indicating need for more acute intervention. . Patient verbalized understanding and all questions were answered. . See orders for this visit as documented in the electronic medical record. . Patient received an After-Visit Summary.  ***  Inda Coke, PA-C

## 2020-07-01 NOTE — Progress Notes (Signed)
Shelley Williams is a 44 y.o. female here for a new problem.  I acted as a Education administrator for [employer], PA-C Anselmo Pickler, LPN   History of Present Illness:   Chief Complaint  Patient presents with  . vaginal odor    HPI   Vaginal odor Pt c/o fishy vaginal odor on Saturday night. She has been having unprotected sex with her boyfriend (her only partner) more frequently than usual. No vaginal discharge noted and no pain with intercourse. Denies: fevers, chills, back pain, dysuria.  Has history of BV in remote past. Using a new lubricant with sex and is unsure if that is causing symptoms. Use Dove body wash -- this is not new for her.  Patient's last menstrual period was 06/17/2020.    Past Medical History:  Diagnosis Date  . Benign gestational thrombocytopenia ([place]) 01/01/2018  . Family history of breast cancer in mother 07/18/2019  . HSV (herpes simplex virus) infection   . Postpartum depression 01/01/2018     Social History   Tobacco Use  . Smoking status: Current Every Day Smoker    Packs/day: 0.50    Types: Cigarettes  . Smokeless tobacco: Never Used  Vaping Use  . Vaping Use: Never used  Substance Use Topics  . Alcohol use: No  . Drug use: No    Past Surgical History:  Procedure Laterality Date  . TUBAL LIGATION Bilateral 02/01/2015   Procedure: POST PARTUM TUBAL LIGATION;  Surgeon: [name], MD;  Location: [facility];  Service: Gynecology;  Laterality: Bilateral;  . WISDOM TOOTH EXTRACTION      Family History  Problem Relation Age of Onset  . Asthma Mother   . Hypertension Mother   . Arthritis Mother   . Breast cancer Mother   . Hypertension Father   . GER disease Father   . Glaucoma Father   . Hyperlipidemia Father   . Pancreatic cancer Father   . Heart disease Maternal Grandmother   . Lung cancer Paternal Grandfather   . Healthy Daughter   . Healthy Son     No Known Allergies  Current Medications:   Current Outpatient Medications:  .   ELDERBERRY PO, Take by mouth., Disp: , Rfl:  .  fluticasone (CUTIVATE) 0.005 % ointment, Apply topically., Disp: , Rfl:  .  OVER THE COUNTER MEDICATION, Sea-moss, Disp: , Rfl:  .  triamcinolone cream (KENALOG) 0.1 %, Apply 1 application topically 2 (two) times daily as needed., Disp: 45 g, Rfl: 2 .  metroNIDAZOLE (FLAGYL) 500 MG tablet, Take 1 tablet (500 mg total) by mouth 2 (two) times daily., Disp: 14 tablet, Rfl: 0   Review of Systems:   ROS Negative unless otherwise specified per HPI.  Vitals:   Vitals:   07/01/20 0853  BP: 102/70  Pulse: 76  Temp: 98.2 F (36.8 C)  TempSrc: Temporal  SpO2: 99%  Weight: 125 lb (56.7 kg)  Height: 5' 3.5" (1.613 m)     Body mass index is 21.8 kg/m.  Physical Exam:   Physical Exam Vitals and nursing note reviewed. Chaperone present: Butch Penny, LPN.  Constitutional:      General: She is not in acute distress.    Appearance: She is well-developed. She is not ill-appearing or toxic-appearing.  Cardiovascular:     Rate and Rhythm: Normal rate and regular rhythm.     Pulses: Normal pulses.     Heart sounds: Normal heart sounds, S1 normal and S2 normal.     Comments: No LE edema  Pulmonary:     Effort: Pulmonary effort is normal.     Breath sounds: Normal breath sounds.  Genitourinary:    Vagina: Normal. No vaginal discharge or tenderness.     Cervix: No cervical motion tenderness or friability.  Skin:    General: Skin is warm and dry.  Neurological:     Mental Status: She is alert.     GCS: GCS eye subscore is 4. GCS verbal subscore is 5. GCS motor subscore is 6.  Psychiatric:        Speech: Speech normal.        Behavior: Behavior normal. Behavior is cooperative.      Assessment and Plan:   [place] was seen today for vaginal odor.  Diagnoses and all orders for this visit:  Vaginal odor No red flags on exam. Will obtain swab today, she is agreeable to STD testing as well. She is traveling to [place] for her birthday this weekend  and is concerned about getting access to her prescriptions. We went ahead and sent in flagyl to her local pharmacy, she prefers to pick this up and have on hand while she travels. I did discuss that she could have significant adverse effects if she drinks ETOH while on this medication. She verbalizes understanding. Will notify patient of results via Mychart and provide recommendations accordingly. Consider more bland lubricant. -     Cervicovaginal ancillary only  Other orders -     metroNIDAZOLE (FLAGYL) 500 MG tablet; Take 1 tablet (500 mg total) by mouth 2 (two) times daily.  CMA or LPN served as scribe during this visit. History, Physical, and Plan performed by medical provider. The above documentation has been reviewed and is accurate and complete.  Inda Coke, PA-C

## 2020-07-02 ENCOUNTER — Other Ambulatory Visit: Payer: Self-pay | Admitting: Physician Assistant

## 2020-07-02 ENCOUNTER — Other Ambulatory Visit: Payer: Self-pay

## 2020-07-02 LAB — CERVICOVAGINAL ANCILLARY ONLY
Bacterial Vaginitis (gardnerella): NEGATIVE
Candida Glabrata: NEGATIVE
Candida Vaginitis: POSITIVE — AB
Chlamydia: NEGATIVE
Comment: NEGATIVE
Comment: NEGATIVE
Comment: NEGATIVE
Comment: NEGATIVE
Comment: NEGATIVE
Comment: NORMAL
Neisseria Gonorrhea: NEGATIVE
Trichomonas: NEGATIVE

## 2020-07-02 MED ORDER — FLUCONAZOLE 150 MG PO TABS: 150.0000 mg | ORAL_TABLET | Freq: Once | ORAL | 0 refills | Status: AC

## 2020-07-02 NOTE — Telephone Encounter (Signed)
yes

## 2020-07-19 ENCOUNTER — Encounter: Payer: Self-pay | Admitting: Family Medicine

## 2020-10-19 ENCOUNTER — Encounter: Payer: Self-pay | Admitting: Family Medicine

## 2020-10-20 NOTE — Telephone Encounter (Signed)
Pt called asking for diflucan for yeast infection. Please advise.

## 2020-10-21 MED ORDER — FLUCONAZOLE 150 MG PO TABS: ORAL_TABLET | ORAL | 0 refills | Status: DC

## 2020-11-09 ENCOUNTER — Telehealth (INDEPENDENT_AMBULATORY_CARE_PROVIDER_SITE_OTHER): Payer: BC Managed Care – PPO | Admitting: Family Medicine

## 2020-11-09 ENCOUNTER — Encounter: Payer: Self-pay | Admitting: Family Medicine

## 2020-11-09 ENCOUNTER — Other Ambulatory Visit: Payer: Self-pay

## 2020-11-09 VITALS — Ht 63.5 in | Wt 125.0 lb

## 2020-11-09 DIAGNOSIS — H00024 Hordeolum internum left upper eyelid: Secondary | ICD-10-CM | POA: Diagnosis not present

## 2020-11-09 MED ORDER — ERYTHROMYCIN 5 MG/GM OP OINT: 1.0000 "application " | TOPICAL_OINTMENT | Freq: Three times a day (TID) | OPHTHALMIC | 0 refills | Status: DC

## 2020-11-09 NOTE — Progress Notes (Signed)
Virtual Visit via Video Note  Subjective  CC:  Chief Complaint  Patient presents with   Acute Visit    Bump under eye     I connected with Park Liter on 11/09/20 at  4:15 PM EST by a video enabled telemedicine application and verified that I am speaking with the correct person using two identifiers. Location patient: Home Location provider: [facility] [place], Office Persons participating in the virtual visit: Coby Antrobus, Leamon Arnt, MD Sherrilee Gilles CMA  I discussed the limitations of evaluation and management by telemedicine and the availability of in person appointments. The patient expressed understanding and agreed to proceed. HPI: Shelley Williams is a 45 y.o. female who was contacted today to address the problems listed above in the chief complaint.  Noticed some irritation of the left eye yesterday.  Removed her contact.  Had some itching and today noticed a bump underneath the upper eyelid.  Bump is small and not draining.  Minimal redness of the eye.  No visual disturbance.  No URI symptoms.  Otherwise feels fine. Assessment  1. Hordeolum internum of left upper eyelid      Plan   Possible stye left upper lid, contact wear: Warm compresses, erythromycin ointment 3 times daily and avoid contact for the next week.  Follow-up in office if not improving. I discussed the assessment and treatment plan with the patient. The patient was provided an opportunity to ask questions and all were answered. The patient agreed with the plan and demonstrated an understanding of the instructions.   The patient was advised to call back or seek an in-person evaluation if the symptoms worsen or if the condition fails to improve as anticipated. Follow up: As needed Visit date not found  Meds ordered this encounter  Medications   erythromycin ophthalmic ointment    Sig: Place 1 application into the left eye 3 (three) times daily.    Dispense:  3.5 g    Refill:   0      I reviewed the patients updated PMH, FH, and SocHx.    Patient Active Problem List   Diagnosis Date Noted   Family history of breast cancer in mother 07/18/2019   Atopic dermatitis 01/11/2019   Herpes simplex type 2 infection 01/01/2018   Nicotine dependence, cigarettes, uncomplicated 32/67/1245   Pruritus 01/01/2018   Current Meds  Medication Sig   ELDERBERRY PO Take by mouth.   erythromycin ophthalmic ointment Place 1 application into the left eye 3 (three) times daily.   fluticasone (CUTIVATE) 0.005 % ointment Apply topically.   OVER THE COUNTER MEDICATION Sea-moss   triamcinolone cream (KENALOG) 0.1 % Apply 1 application topically 2 (two) times daily as needed.    Allergies: Patient has No Known Allergies. Family History: Patient family history includes Arthritis in her mother; Asthma in her mother; Breast cancer in her mother; GER disease in her father; Glaucoma in her father; Healthy in her daughter and son; Heart disease in her maternal grandmother; Hyperlipidemia in her father; Hypertension in her father and mother; Lung cancer in her paternal grandfather; Pancreatic cancer in her father. Social History:  Patient  reports that she has been smoking cigarettes. She has been smoking about 0.50 packs per day. She has never used smokeless tobacco. She reports that she does not drink alcohol and does not use drugs.  Review of Systems: Constitutional: Negative for fever malaise or anorexia Cardiovascular: negative for chest pain Respiratory: negative for  SOB or persistent cough Gastrointestinal: negative for abdominal pain  OBJECTIVE Vitals: Ht 5' 3.5" (1.613 m)    Wt 125 lb (56.7 kg)    BMI 21.80 kg/m  General: no acute distress , A&Ox3 Whites of eyes appear normal, possible small left upper lid nodule but difficult to visualize video  Leamon Arnt, MD

## 2020-11-17 ENCOUNTER — Other Ambulatory Visit: Payer: Self-pay | Admitting: Family Medicine

## 2020-11-17 DIAGNOSIS — Z1231 Encounter for screening mammogram for malignant neoplasm of breast: Secondary | ICD-10-CM

## 2021-01-11 ENCOUNTER — Other Ambulatory Visit: Payer: Self-pay

## 2021-01-11 ENCOUNTER — Ambulatory Visit
Admission: RE | Admit: 2021-01-11 | Discharge: 2021-01-11 | Disposition: A | Discharge status: Home / Self Care | Payer: BC Managed Care – PPO | Source: Ambulatory Visit | Attending: Family Medicine | Admitting: Family Medicine

## 2021-01-11 DIAGNOSIS — Z1231 Encounter for screening mammogram for malignant neoplasm of breast: Secondary | ICD-10-CM | POA: Diagnosis not present

## 2021-02-08 ENCOUNTER — Encounter: Payer: Self-pay | Admitting: Family Medicine

## 2021-02-09 ENCOUNTER — Ambulatory Visit: Payer: BC Managed Care – PPO | Admitting: Family Medicine

## 2021-04-02 ENCOUNTER — Encounter: Payer: Self-pay | Admitting: Family Medicine

## 2021-04-03 ENCOUNTER — Encounter: Payer: Self-pay | Admitting: Family Medicine

## 2021-04-05 NOTE — Telephone Encounter (Signed)
Pt requesting refill on Fluticasone 0.005% ointment for her Eczema. Has not been prescribed by you before.

## 2021-04-07 MED ORDER — TRIAMCINOLONE ACETONIDE 0.1 % EX CREA: 1.0000 "application " | TOPICAL_CREAM | Freq: Two times a day (BID) | CUTANEOUS | 2 refills | Status: DC | PRN

## 2021-04-07 NOTE — Telephone Encounter (Signed)
Spoke to pt told her per Dr. Jonni Sanger, This steroid cream Fluticasone should not be used on the face.  Perhaps derm ordered it for her hands. Pt said they said I could use it on my face. Told pt she would need to contact Dermatology if she wants that due to Dr. Jonni Sanger will not order. She said you may have triamcinolon 0.1% if she'd like. Pt verbalized understanding and said yes. Told her will send Rx. Pt verbalized understanding.

## 2021-04-12 ENCOUNTER — Encounter: Payer: BC Managed Care – PPO | Admitting: Family Medicine

## 2021-07-12 ENCOUNTER — Encounter: Payer: BC Managed Care – PPO | Admitting: Family Medicine

## 2021-08-16 ENCOUNTER — Encounter: Payer: Medicaid Other | Admitting: Family Medicine

## 2021-08-26 ENCOUNTER — Ambulatory Visit (INDEPENDENT_AMBULATORY_CARE_PROVIDER_SITE_OTHER): Payer: Self-pay | Admitting: Family Medicine

## 2021-08-26 ENCOUNTER — Encounter: Payer: Self-pay | Admitting: Family Medicine

## 2021-08-26 VITALS — BP 110/60 | HR 90 | Temp 98.2°F | Ht 63.5 in | Wt 121.4 lb

## 2021-08-26 DIAGNOSIS — R1032 Left lower quadrant pain: Secondary | ICD-10-CM

## 2021-08-26 DIAGNOSIS — R1084 Generalized abdominal pain: Secondary | ICD-10-CM

## 2021-08-26 DIAGNOSIS — L2084 Intrinsic (allergic) eczema: Secondary | ICD-10-CM

## 2021-08-26 LAB — CBC
HCT: 38.5 % (ref 36.0–46.0)
Hemoglobin: 12.9 g/dL (ref 12.0–15.0)
MCHC: 33.4 g/dL (ref 30.0–36.0)
MCV: 102.9 fl — ABNORMAL HIGH (ref 78.0–100.0)
Platelets: 219 10*3/uL (ref 150.0–400.0)
RBC: 3.74 Mil/uL — ABNORMAL LOW (ref 3.87–5.11)
RDW: 12.7 % (ref 11.5–15.5)
WBC: 9.6 10*3/uL (ref 4.0–10.5)

## 2021-08-26 LAB — COMPREHENSIVE METABOLIC PANEL
ALT: 14 U/L (ref 0–35)
AST: 19 U/L (ref 0–37)
Albumin: 4.4 g/dL (ref 3.5–5.2)
Alkaline Phosphatase: 45 U/L (ref 39–117)
BUN: 8 mg/dL (ref 6–23)
CO2: 25 mEq/L (ref 19–32)
Calcium: 9.7 mg/dL (ref 8.4–10.5)
Chloride: 104 mEq/L (ref 96–112)
Creatinine, Ser: 0.66 mg/dL (ref 0.40–1.20)
GFR: 106.15 mL/min (ref >60.00)
Glucose, Bld: 79 mg/dL (ref 70–99)
Potassium: 3.7 mEq/L (ref 3.5–5.1)
Sodium: 138 mEq/L (ref 135–145)
Total Bilirubin: 0.5 mg/dL (ref 0.2–1.2)
Total Protein: 7.6 g/dL (ref 6.0–8.3)

## 2021-08-26 LAB — POCT URINALYSIS DIPSTICK
Bilirubin, UA: NEGATIVE
Blood, UA: NEGATIVE
Glucose, UA: NEGATIVE
Ketones, UA: NEGATIVE
Leukocytes, UA: NEGATIVE
Nitrite, UA: NEGATIVE
Protein, UA: NEGATIVE
Spec Grav, UA: 1.01 (ref 1.010–1.025)
Urobilinogen, UA: 0.2 E.U./dL
pH, UA: 6 (ref 5.0–8.0)

## 2021-08-26 LAB — POCT URINE PREGNANCY: Preg Test, Ur: NEGATIVE

## 2021-08-26 NOTE — Progress Notes (Addendum)
Subjective:     Patient ID: Shelley Williams, female    DOB: 05-14-1976, 45 y.o.   MRN: [id number]  Chief Complaint  Patient presents with   Abdominal Pain    Started yesterday around 3 pm Had a small bm Dull, off and on Took naproxen on yesterday     HPI-last normal bp 2 days ago.  Since yesterday at 330-lower abd pain-was just sitting in chair.  Heating pad and naproxen yesterday.  Woke up this am and still there. No dysuia/freq. No v/d.  No f/c. LMP-just finished. Normal. Has TL. No blood in stool. No abn vag d/c. No back pain Mild constant dull pain, but can inc in intensity.  Pain 5-6/10  2.  Eczema R palm not resolving.  Using moisturizer daily but steroid cream occassionally  Health Maintenance Due  Topic Date Due   Pneumococcal Vaccine 34-24 Years old (1 - PCV) Never done   INFLUENZA VACCINE  04/05/2021   COLONOSCOPY (Pts 45-51yrs Insurance coverage will need to be confirmed)  Never done    Past Medical History:  Diagnosis Date   Benign gestational thrombocytopenia ([place]) 01/01/2018   Family history of breast cancer in mother 07/18/2019   HSV (herpes simplex virus) infection    Postpartum depression 01/01/2018    Past Surgical History:  Procedure Laterality Date   TUBAL LIGATION Bilateral 02/01/2015   Procedure: POST PARTUM TUBAL LIGATION;  Surgeon: [name], MD;  Location: [facility];  Service: Gynecology;  Laterality: Bilateral;   WISDOM TOOTH EXTRACTION      Outpatient Medications Prior to Visit  Medication Sig Dispense Refill   ELDERBERRY PO Take by mouth.     fluticasone (CUTIVATE) 0.005 % ointment Apply topically.     OVER THE COUNTER MEDICATION Sea-moss     triamcinolone cream (KENALOG) 0.1 % Apply 1 application topically 2 (two) times daily as needed. 45 g 2   erythromycin ophthalmic ointment Place 1 application into the left eye 3 (three) times daily. (Patient not taking: Reported on 08/26/2021) 3.5 g 0   No facility-administered medications prior to  visit.    No Known Allergies [id number] except as noted in HPI More active so has lost 5 pounds      Objective:     BP 110/60    Pulse 90    Temp 98.2 F (36.8 C) (Temporal)    Ht 5' 3.5" (1.613 m)    Wt 121 lb 6 oz (55.1 kg)    LMP 08/22/2021 (Exact Date)    SpO2 99%    BMI 21.16 kg/m  Wt Readings from Last 3 Encounters:  08/26/21 121 lb 6 oz (55.1 kg)  11/09/20 125 lb (56.7 kg)  07/01/20 125 lb (56.7 kg)        Gen: WDWN NAD AAF HEENT: NCAT, conjunctiva not injected, sclera nonicteric NECK:  supple, no thyromegaly, no nodes, no carotid bruits CARDIAC: RRR, S1S2+, no murmur. DP 2+B LUNGS: CTAB. No wheezes ABDOMEN:  BS+, soft, mod tender whole L side, No HSM, no masses. No CVAT EXT:  no edema MSK: no gross abnormalities.  NEURO: A&O x3.  CN II-XII intact.  PSYCH: normal mood. Good eye contact  R palm-base-peeling skin w/some deep vessicles in about a 1cm patch.    Results for orders placed or performed in visit on 08/26/21  POCT urinalysis dipstick  Result Value Ref Range   Color, UA yellow    Clarity, UA clear    Glucose, UA Negative Negative  Bilirubin, UA negative    Ketones, UA negatvie    Spec Grav, UA 1.010 1.010 - 1.025   Blood, UA negative    pH, UA 6.0 5.0 - 8.0   Protein, UA Negative Negative   Urobilinogen, UA 0.2 0.2 or 1.0 E.U./dL   Nitrite, UA negative    Leukocytes, UA Negative Negative   Appearance     Odor      Results for orders placed or performed in visit on 08/26/21  POCT urinalysis dipstick  Result Value Ref Range   Color, UA yellow    Clarity, UA clear    Glucose, UA Negative Negative   Bilirubin, UA negative    Ketones, UA negatvie    Spec Grav, UA 1.010 1.010 - 1.025   Blood, UA negative    pH, UA 6.0 5.0 - 8.0   Protein, UA Negative Negative   Urobilinogen, UA 0.2 0.2 or 1.0 E.U./dL   Nitrite, UA negative    Leukocytes, UA Negative Negative   Appearance     Odor    POCT urine pregnancy  Result Value  Ref Range   Preg Test, Ur Negative Negative   Assessment & Plan:   Problem List Items Addressed This Visit       Musculoskeletal and Integument   Atopic dermatitis   Other Visit Diagnoses     Generalized abdominal pain    -  Primary   Relevant Orders   CBC   Comprehensive metabolic panel   POCT Pregnancy, Urine   POCT urinalysis dipstick   Left lower quadrant abdominal pain       Relevant Orders   CBC   Comprehensive metabolic panel   POCT Pregnancy, Urine   POCT urinalysis dipstick   CT Abdomen Pelvis W Contrast      L abd pain.  Doubt pregnancy as TL and finished menses.  But will check upreg.  Will check labs and CT-pt wants to do.  Prob diverticulitis or other. Await results for tx plan.  Worse, no change, ER.     There was insurance issues and pt spent hours on phone w/them to get verified.  Was offered empiric therapy for diverticulitis, but she wants to do CT first.  Also offered ER but pt decided to continue trying to get insurance figured out.  Eczema-use moisturizer and steroid cream bid x14d and if not improved, may need derm  CT shows diverticulitis.  D/w pt.  Will do abx.  Worse, fevers, to ER  No orders of the defined types were placed in this encounter.   [place]ellington Hampshire., MD

## 2021-08-26 NOTE — Patient Instructions (Signed)
°  You can take tylenol for pain/fevers °If worsening symptoms, let us know or go to the Emergency room  °

## 2021-08-27 ENCOUNTER — Other Ambulatory Visit: Payer: Self-pay | Admitting: Family Medicine

## 2021-08-27 ENCOUNTER — Ambulatory Visit (HOSPITAL_BASED_OUTPATIENT_CLINIC_OR_DEPARTMENT_OTHER)
Admission: RE | Admit: 2021-08-27 | Discharge: 2021-08-27 | Disposition: A | Discharge status: Home / Self Care | Payer: Self-pay | Source: Ambulatory Visit | Attending: Family Medicine | Admitting: Family Medicine

## 2021-08-27 ENCOUNTER — Encounter (HOSPITAL_BASED_OUTPATIENT_CLINIC_OR_DEPARTMENT_OTHER): Payer: Self-pay

## 2021-08-27 ENCOUNTER — Other Ambulatory Visit: Payer: Self-pay

## 2021-08-27 DIAGNOSIS — R1032 Left lower quadrant pain: Secondary | ICD-10-CM | POA: Insufficient documentation

## 2021-08-27 MED ORDER — AMOXICILLIN-POT CLAVULANATE 875-125 MG PO TABS: 1.0000 | ORAL_TABLET | Freq: Two times a day (BID) | ORAL | 0 refills | Status: DC

## 2021-08-27 MED ORDER — TRAMADOL HCL 50 MG PO TABS: 50.0000 mg | ORAL_TABLET | Freq: Four times a day (QID) | ORAL | 0 refills | Status: AC | PRN

## 2021-08-27 MED ORDER — IOHEXOL 300 MG/ML  SOLN
100.0000 mL | Freq: Once | INTRAMUSCULAR | Status: AC | PRN
  Administered 2021-08-27: 15:00:00 80 mL via INTRAVENOUS

## 2021-08-27 NOTE — Progress Notes (Signed)
Will do tramadol for pain.  Getting Ct today.  Naproxen not holding

## 2021-08-27 NOTE — Addendum Note (Signed)
Addended by: Wellington Hampshire on: 08/27/2021 04:18 PM   Modules accepted: Orders

## 2021-09-01 ENCOUNTER — Encounter: Payer: Self-pay | Admitting: Family Medicine

## 2021-09-01 MED ORDER — FLUCONAZOLE 150 MG PO TABS: ORAL_TABLET | ORAL | 0 refills | Status: DC

## 2021-11-26 ENCOUNTER — Encounter: Payer: Medicaid Other | Admitting: Family Medicine

## 2022-02-11 ENCOUNTER — Encounter: Payer: Self-pay | Admitting: Family Medicine

## 2022-02-11 ENCOUNTER — Ambulatory Visit (INDEPENDENT_AMBULATORY_CARE_PROVIDER_SITE_OTHER): Payer: 59 | Admitting: Family Medicine

## 2022-02-11 VITALS — BP 90/64 | HR 70 | Temp 98.9°F | Ht 63.5 in | Wt 119.8 lb

## 2022-02-11 DIAGNOSIS — Z1231 Encounter for screening mammogram for malignant neoplasm of breast: Secondary | ICD-10-CM

## 2022-02-11 DIAGNOSIS — J301 Allergic rhinitis due to pollen: Secondary | ICD-10-CM | POA: Insufficient documentation

## 2022-02-11 DIAGNOSIS — M76892 Other specified enthesopathies of left lower limb, excluding foot: Secondary | ICD-10-CM

## 2022-02-11 DIAGNOSIS — Z1211 Encounter for screening for malignant neoplasm of colon: Secondary | ICD-10-CM | POA: Diagnosis not present

## 2022-02-11 DIAGNOSIS — Z0000 Encounter for general adult medical examination without abnormal findings: Secondary | ICD-10-CM

## 2022-02-11 DIAGNOSIS — L2084 Intrinsic (allergic) eczema: Secondary | ICD-10-CM

## 2022-02-11 DIAGNOSIS — Z1212 Encounter for screening for malignant neoplasm of rectum: Secondary | ICD-10-CM

## 2022-02-11 DIAGNOSIS — F17210 Nicotine dependence, cigarettes, uncomplicated: Secondary | ICD-10-CM | POA: Diagnosis not present

## 2022-02-11 LAB — LIPID PANEL
Cholesterol: 195 mg/dL (ref 0–200)
HDL: 77.9 mg/dL (ref >39.00)
LDL Cholesterol: 109 mg/dL — ABNORMAL HIGH (ref 0–99)
NonHDL: 116.92
Total CHOL/HDL Ratio: 3
Triglycerides: 41 mg/dL (ref 0.0–149.0)
VLDL: 8.2 mg/dL (ref 0.0–40.0)

## 2022-02-11 LAB — COMPREHENSIVE METABOLIC PANEL
ALT: 15 U/L (ref 0–35)
AST: 22 U/L (ref 0–37)
Albumin: 4.2 g/dL (ref 3.5–5.2)
Alkaline Phosphatase: 41 U/L (ref 39–117)
BUN: 8 mg/dL (ref 6–23)
CO2: 22 mEq/L (ref 19–32)
Calcium: 9.2 mg/dL (ref 8.4–10.5)
Chloride: 100 mEq/L (ref 96–112)
Creatinine, Ser: 0.64 mg/dL (ref 0.40–1.20)
GFR: 106.59 mL/min (ref >60.00)
Glucose, Bld: 78 mg/dL (ref 70–99)
Potassium: 3.6 mEq/L (ref 3.5–5.1)
Sodium: 132 mEq/L — ABNORMAL LOW (ref 135–145)
Total Bilirubin: 0.5 mg/dL (ref 0.2–1.2)
Total Protein: 7 g/dL (ref 6.0–8.3)

## 2022-02-11 LAB — CBC WITH DIFFERENTIAL/PLATELET
Basophils Absolute: 0.1 10*3/uL (ref 0.0–0.1)
Basophils Relative: 0.8 % (ref 0.0–3.0)
Eosinophils Absolute: 0.1 10*3/uL (ref 0.0–0.7)
Eosinophils Relative: 1.5 % (ref 0.0–5.0)
HCT: 37.6 % (ref 36.0–46.0)
Hemoglobin: 12.5 g/dL (ref 12.0–15.0)
Lymphocytes Relative: 28.4 % (ref 12.0–46.0)
Lymphs Abs: 2.1 10*3/uL (ref 0.7–4.0)
MCHC: 33.2 g/dL (ref 30.0–36.0)
MCV: 102.2 fl — ABNORMAL HIGH (ref 78.0–100.0)
Monocytes Absolute: 0.7 10*3/uL (ref 0.1–1.0)
Monocytes Relative: 9 % (ref 3.0–12.0)
Neutro Abs: 4.5 10*3/uL (ref 1.4–7.7)
Neutrophils Relative %: 60.3 % (ref 43.0–77.0)
Platelets: 159 10*3/uL (ref 150.0–400.0)
RBC: 3.68 Mil/uL — ABNORMAL LOW (ref 3.87–5.11)
RDW: 13.3 % (ref 11.5–15.5)
WBC: 7.4 10*3/uL (ref 4.0–10.5)

## 2022-02-11 MED ORDER — BETAMETHASONE DIPROPIONATE 0.05 % EX CREA: TOPICAL_CREAM | Freq: Two times a day (BID) | CUTANEOUS | 0 refills | Status: DC

## 2022-02-11 NOTE — Patient Instructions (Signed)
Please return in 12 months for your annual complete physical; please come fasting.   I will release your lab results to you on your MyChart account with further instructions. You may see the results before I do, but when I review them I will send you a message with my report or have my assistant call you if things need to be discussed. Please reply to my message with any questions. Thank you!   We will call you with information regarding your referral appointment with GI for colonoscopy.  If you do not hear from [place] within the next 2 weeks, please let me know. It can take 1-2 weeks to get appointments set up with the specialists.    Use ice and motrin for your knee pain .  If you have any questions or concerns, please don't hesitate to send me a message via MyChart or call the office at [phone]. Thank you for visiting with [place] today! It's our pleasure caring for you.   I have ordered a mammogram and/or bone density for you as we discussed today: '[x]'$   Mammogram  '[]'$   Bone Density  Please call the office checked below to schedule your appointment:  '[x]'$   [facility]      [address] [place], [place]         '[]'$   [facility]  [address] [place], [place]

## 2022-02-11 NOTE — Progress Notes (Signed)
Subjective  Chief Complaint  Patient presents with   Annual Exam    Pt here for Annual Exam and is currently fasting     HPI: Shelley Williams is a 46 y.o. female who presents to [facility] [place] today for a Female Wellness Visit. She also has the concerns and/or needs as listed above in the chief complaint. These will be addressed in addition to the Health Maintenance Visit.   Wellness Visit: annual visit with health maintenance review and exam without Pap  HM: due mammo and CRC screen. Avg risk. Doing well. 2 kids. Works from home. Monogamous relationship. Still some smoker. Tubal. Menses are regular.  Chronic disease f/u and/or acute problem visit: (deemed necessary to be done in addition to the wellness visit): C/o allergies. Took first allergy pill today. No sxs of infection C/o 2-3 day of left medial knee pain. No injury. Pain only with walking and it is mild. No knee swelling or prior knee pain.  Eczema on palm; triamcinolone is not helping. Chronic problem. Precontemplative about smoking cessation  Assessment  1. Annual physical exam   2. Nicotine dependence, cigarettes, uncomplicated   3. Screening for colorectal cancer   4. Encounter for screening mammogram for breast cancer   5. Intrinsic atopic dermatitis   6. Seasonal allergic rhinitis due to pollen   7. Left knee tendonitis      Plan  Female Wellness Visit: Age appropriate Health Maintenance and Prevention measures were discussed with patient. Included topics are cancer screening recommendations, ways to keep healthy (see AVS) including dietary and exercise recommendations, regular eye and dental care, use of seat belts, and avoidance of moderate alcohol use and tobacco use. Ordered mammo and referral to GI for colonoscopy.  BMI: discussed patient's BMI and encouraged positive lifestyle modifications to help get to or maintain a target BMI. HM needs and immunizations were addressed and ordered. See  below for orders. See HM and immunization section for updates. Routine labs and screening tests ordered including cmp, cbc and lipids where appropriate. Discussed recommendations regarding Vit D and calcium supplementation (see AVS)  Chronic disease management visit and/or acute problem visit: Smoker: counseling done Allergies: rec zyrtec or allegra daily.  Knee pain : mild knee tendonitis :RICE tx. Discussed shoe ware  Follow up: Return in about 1 year (around 02/12/2023) for complete physical.  Orders Placed This Encounter  Procedures   MM DIGITAL SCREENING BILATERAL   CBC with Differential/Platelet   Comprehensive metabolic panel   Lipid panel   Ambulatory referral to Gastroenterology   Meds ordered this encounter  Medications   betamethasone dipropionate 0.05 % cream    Sig: Apply topically 2 (two) times daily. For a week as needed. Do not use on your face    Dispense:  45 g    Refill:  0      Body mass index is 20.89 kg/m. Wt Readings from Last 3 Encounters:  02/11/22 119 lb 12.8 oz (54.3 kg)  08/26/21 121 lb 6 oz (55.1 kg)  11/09/20 125 lb (56.7 kg)     Patient Active Problem List   Diagnosis Date Noted   Seasonal allergic rhinitis due to pollen 02/11/2022   Family history of breast cancer in mother 07/18/2019    Mom age 76, 2020    Atopic dermatitis 01/11/2019   Herpes simplex type 2 infection 01/01/2018    Rare outbreak      Nicotine dependence, cigarettes, uncomplicated 54/00/8676   Pruritus 01/01/2018  Health Maintenance  Topic Date Due   COLONOSCOPY (Pts 45-28yr Insurance coverage will need to be confirmed)  Never done   COVID-19 Vaccine (1) 02/27/2022 (Originally 01/03/1977)   INFLUENZA VACCINE  04/05/2022   PAP SMEAR-Modifier  01/02/2023   TETANUS/TDAP  11/17/2024   Hepatitis C Screening  Completed   HIV Screening  Completed   HPV VACCINES  Aged Out   Immunization History  Administered Date(s) Administered   Hepatitis B, ped/adol 05/30/2014    Influenza, Seasonal, Injecte, Preservative Fre 06/05/2016   Influenza,inj,quad, With Preservative 06/05/2014   Tdap 09/05/2010, 06/03/2011, 11/18/2014   We updated and reviewed the patient's past history in detail and it is documented below. Allergies: Patient has No Known Allergies. Past Medical History Patient  has a past medical history of Benign gestational thrombocytopenia (HDerma (01/01/2018), Family history of breast cancer in mother (07/18/2019), HSV (herpes simplex virus) infection, and Postpartum depression (01/01/2018). Past Surgical History Patient  has a past surgical history that includes Wisdom tooth extraction and Tubal ligation (Bilateral, 02/01/2015). Family History: Patient family history includes Arthritis in her mother; Asthma in her mother; Breast cancer (age of onset: 798 in her mother; GER disease in her father; Glaucoma in her father; Healthy in her daughter and son; Heart disease in her maternal grandmother; Hyperlipidemia in her father; Hypertension in her father and mother; Lung cancer in her paternal grandfather; Pancreatic cancer in her father. Social History:  Patient  reports that she has been smoking cigarettes. She has been smoking an average of .5 packs per day. She has never used smokeless tobacco. She reports that she does not drink alcohol and does not use drugs.  Review of Systems: Constitutional: negative for fever or malaise Ophthalmic: negative for photophobia, double vision or loss of vision Cardiovascular: negative for chest pain, dyspnea on exertion, or new LE swelling Respiratory: negative for SOB or persistent cough Gastrointestinal: negative for abdominal pain, change in bowel habits or melena Genitourinary: negative for dysuria or gross hematuria, no abnormal uterine bleeding or disharge Musculoskeletal: negative for new gait disturbance or muscular weakness Integumentary: negative for new or persistent rashes, no breast lumps Neurological:  negative for TIA or stroke symptoms Psychiatric: negative for SI or delusions Allergic/Immunologic: negative for hives  Patient Care Team    Relationship Specialty Notifications Start End  ALeamon Arnt MD PCP - General Family Medicine  01/01/18     Objective  Vitals: BP 90/64   Pulse 70   Temp 98.9 F (37.2 C)   Ht 5' 3.5" (1.613 m)   Wt 119 lb 12.8 oz (54.3 kg)   SpO2 99%   BMI 20.89 kg/m  General:  Well developed, well nourished, no acute distress  Psych:  Alert and orientedx3,normal mood and affect HEENT:  Normocephalic, atraumatic, non-icteric sclera,  supple neck without adenopathy, mass or thyromegaly Cardiovascular:  Normal S1, S2, RRR without gallop, rub or murmur Respiratory:  Good breath sounds bilaterally, CTAB with normal respiratory effort Gastrointestinal: normal bowel sounds, soft, non-tender, no noted masses. No HSM MSK: no deformities, contusions. Joints are without erythema or swelling. Left knee normal exam but for mild medial ttp and pain with walking Skin:  Warm, no rashes or suspicious lesions noted Neurologic:    Mental status is normal. CN 2-11 are normal. Gross motor and sensory exams are normal. Normal gait. No tremor   Commons side effects, risks, benefits, and alternatives for medications and treatment plan prescribed today were discussed, and the patient expressed understanding of the given  instructions. Patient is instructed to call or message via MyChart if he/she has any questions or concerns regarding our treatment plan. No barriers to understanding were identified. We discussed Red Flag symptoms and signs in detail. Patient expressed understanding regarding what to do in case of urgent or emergency type symptoms.  Medication list was reconciled, printed and provided to the patient in AVS. Patient instructions and summary information was reviewed with the patient as documented in the AVS. This note was prepared with assistance of Dragon voice  recognition software. Occasional wrong-word or sound-a-like substitutions may have occurred due to the inherent limitations of voice recognition software  This visit occurred during the SARS-CoV-2 public health emergency.  Safety protocols were in place, including screening questions prior to the visit, additional usage of staff PPE, and extensive cleaning of exam room while observing appropriate contact time as indicated for disinfecting solutions.

## 2022-02-25 ENCOUNTER — Ambulatory Visit
Admission: RE | Admit: 2022-02-25 | Discharge: 2022-02-25 | Disposition: A | Discharge status: Home / Self Care | Payer: 59 | Source: Ambulatory Visit | Attending: Family Medicine | Admitting: Family Medicine

## 2022-02-25 DIAGNOSIS — Z1231 Encounter for screening mammogram for malignant neoplasm of breast: Secondary | ICD-10-CM

## 2022-03-01 ENCOUNTER — Other Ambulatory Visit: Payer: Self-pay | Admitting: Family Medicine

## 2022-03-01 DIAGNOSIS — R928 Other abnormal and inconclusive findings on diagnostic imaging of breast: Secondary | ICD-10-CM

## 2022-03-06 ENCOUNTER — Encounter: Payer: Self-pay | Admitting: Family Medicine

## 2022-03-07 ENCOUNTER — Ambulatory Visit
Admission: RE | Admit: 2022-03-07 | Discharge: 2022-03-07 | Disposition: A | Discharge status: Home / Self Care | Payer: 59 | Source: Ambulatory Visit | Attending: Family Medicine | Admitting: Family Medicine

## 2022-03-07 ENCOUNTER — Ambulatory Visit: Payer: 59

## 2022-03-07 DIAGNOSIS — R928 Other abnormal and inconclusive findings on diagnostic imaging of breast: Secondary | ICD-10-CM

## 2022-03-15 ENCOUNTER — Encounter: Payer: Self-pay | Admitting: Family Medicine

## 2022-03-15 MED ORDER — FLUCONAZOLE 150 MG PO TABS: ORAL_TABLET | ORAL | 0 refills | Status: DC

## 2022-04-22 IMAGING — CT CT ABD-PELV W/ CM
2 of 5 series · 17 of 46 positions shown, 19 images · IV contrast (APPLIED)
Comparison: None.

CLINICAL DATA: Left lower quadrant pain for 2 days.

EXAM:
CT ABDOMEN AND PELVIS WITH CONTRAST
TECHNIQUE: Multidetector CT imaging of the abdomen and pelvis was performed
using the standard protocol following bolus administration of
intravenous contrast.
CONTRAST:  80mL OMNIPAQUE IOHEXOL 300 MG/ML  SOLN

[Series 2: abd pel w · axial · 0.66mm/px · z∈[-418,-58]mm · 14 of 80 slices shown, 16 images]
[im 4/80  soft-tissue]
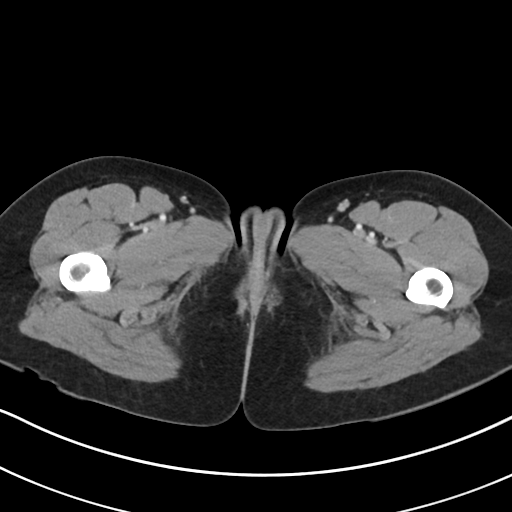
[im 4/80  bone]
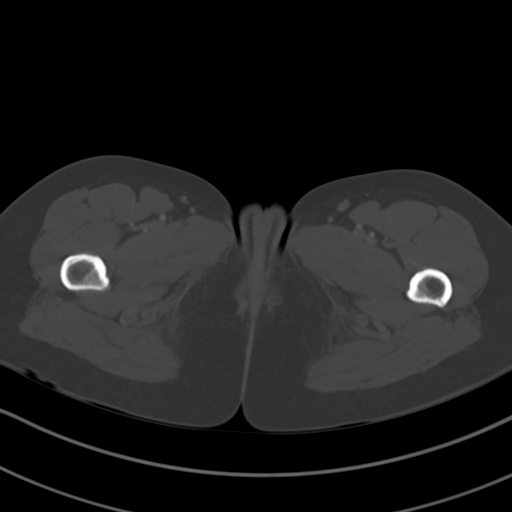
[im 12/80  soft-tissue]
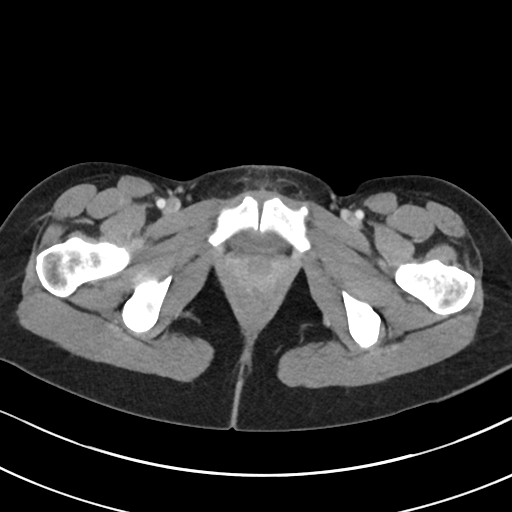
[im 16/80  soft-tissue]
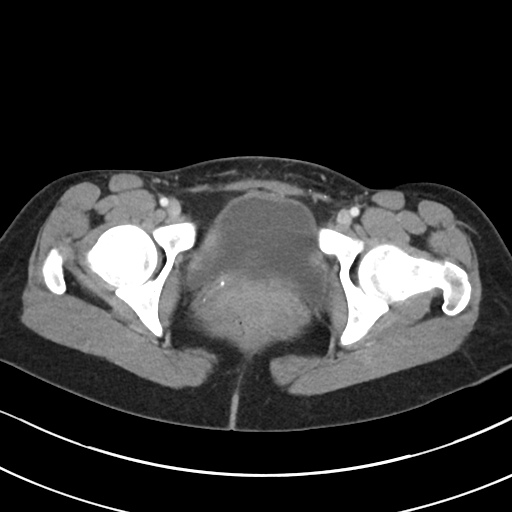
[im 20/80  soft-tissue]
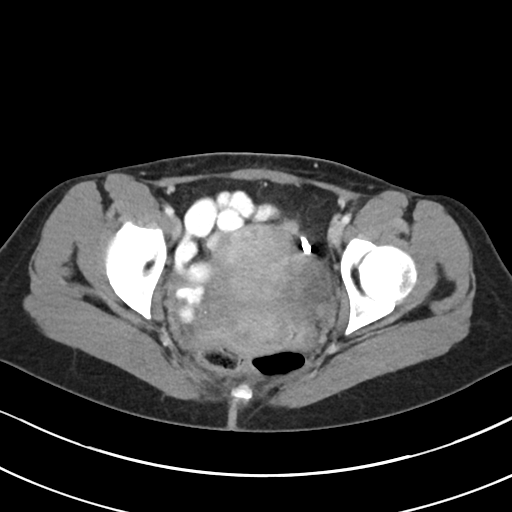
[im 28/80  soft-tissue]
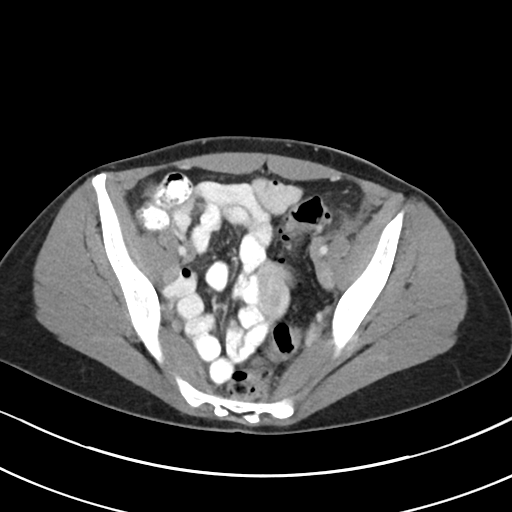
[im 32/80  soft-tissue]
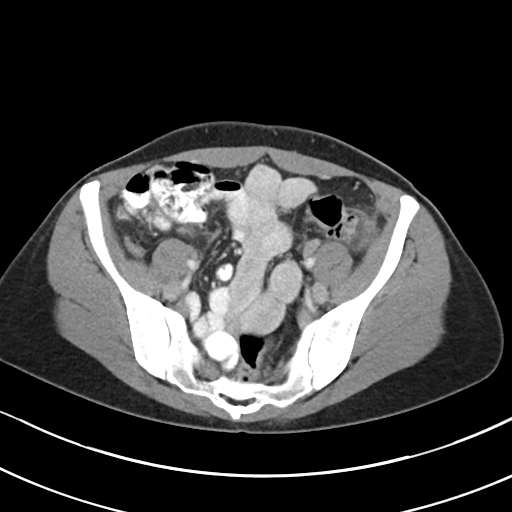
[im 36/80  soft-tissue]
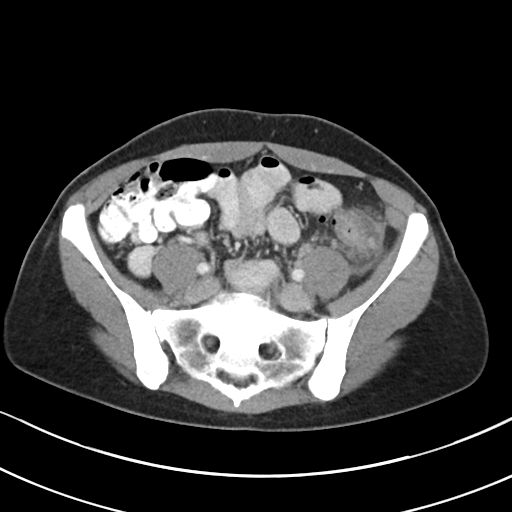
[im 44/80  soft-tissue]
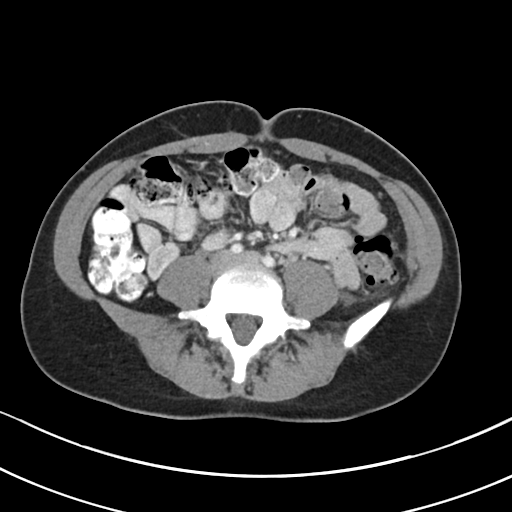
[im 48/80  soft-tissue]
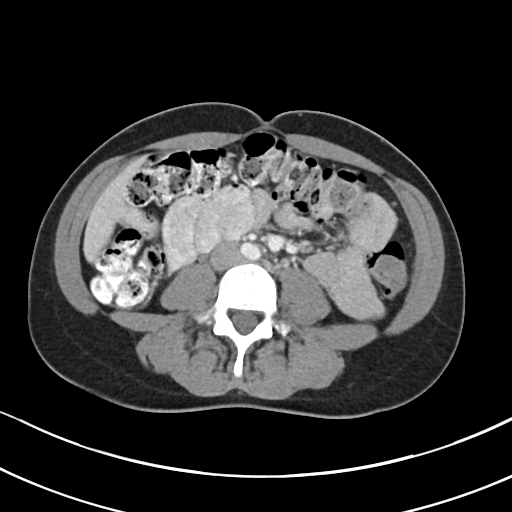
[im 48/80  bone]
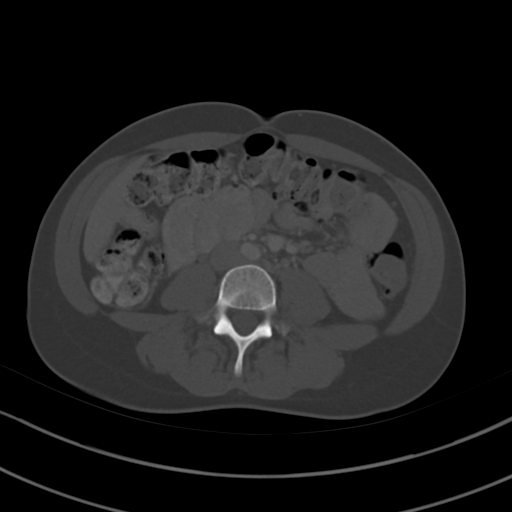
[im 52/80  soft-tissue]
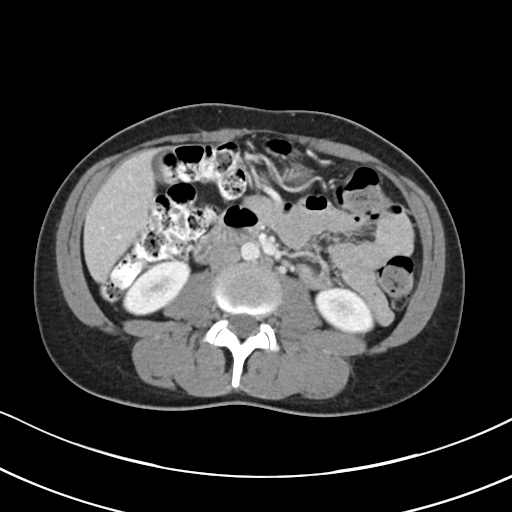
[im 60/80  soft-tissue]
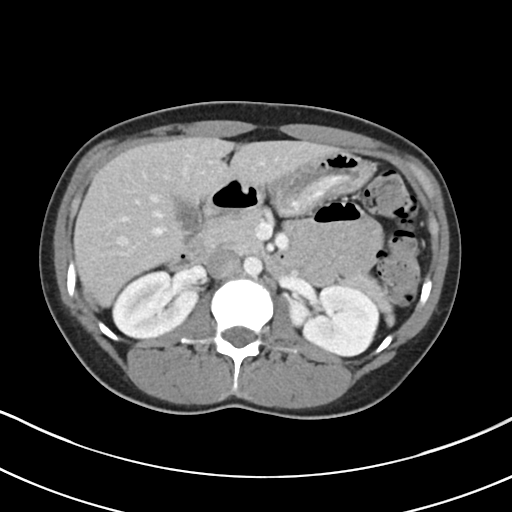
[im 64/80  soft-tissue]
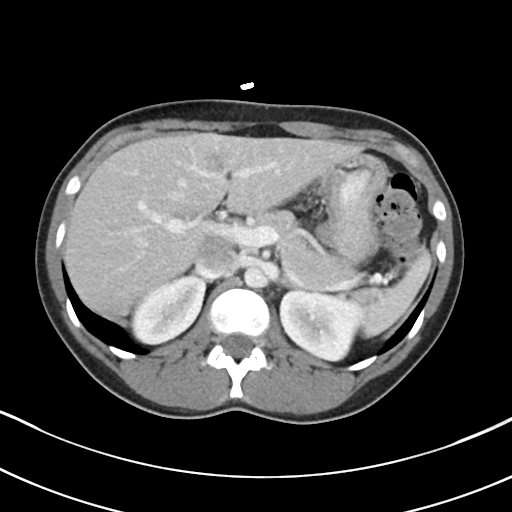
[im 68/80  soft-tissue]
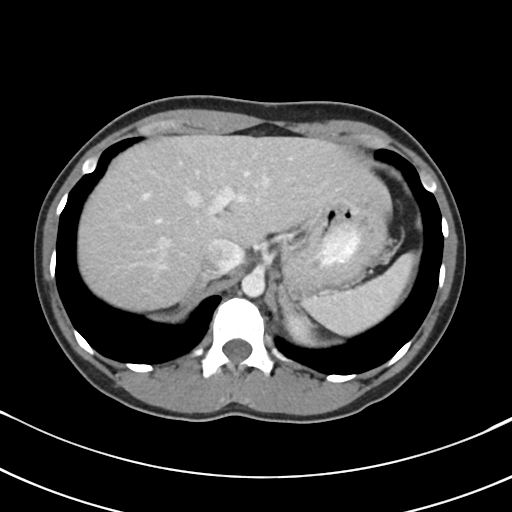
[im 76/80  soft-tissue]
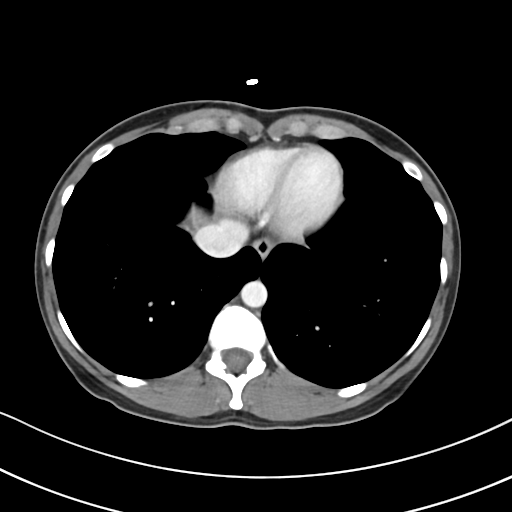

[Series 5: coronal · coronal · 0.72mm/px · 3 of 74 slices shown]
[im 25/74  soft-tissue]
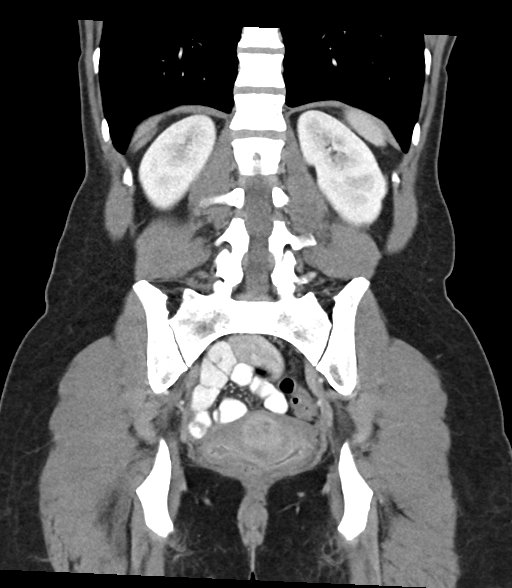
[im 33/74  soft-tissue]
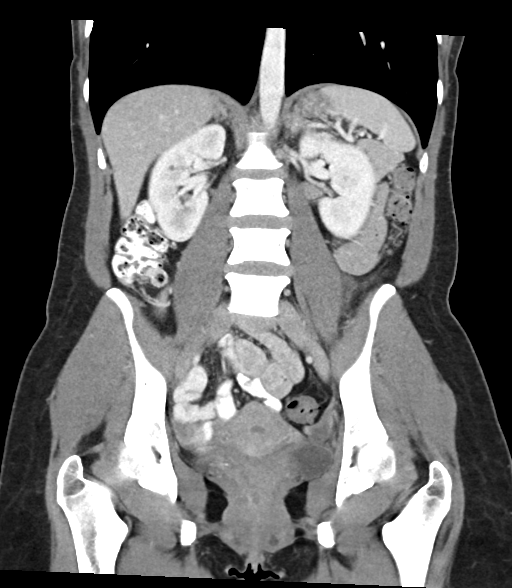
[im 41/74  soft-tissue]
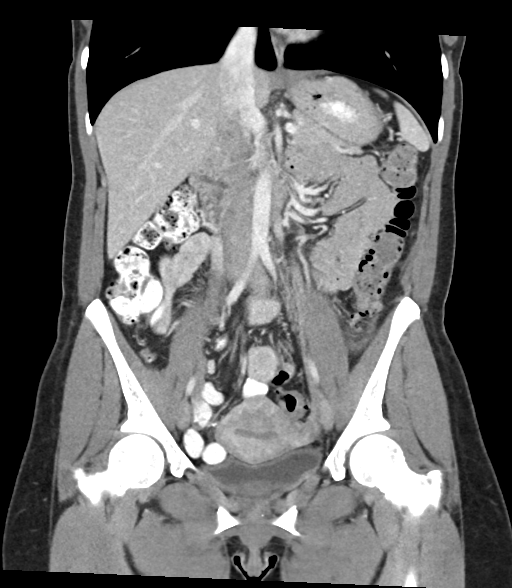

[17 of 46 positions shown; findings below may reference images not displayed]

FINDINGS: Lower Chest: No acute findings.

Hepatobiliary: No hepatic masses identified. Gallbladder is
unremarkable. No evidence of biliary ductal dilatation.

Pancreas:  No mass or inflammatory changes.

Spleen: Within normal limits in size and appearance.

Adrenals/Urinary Tract: No masses identified. No evidence of
ureteral calculi or hydronephrosis.

Stomach/Bowel: Mild moderate diverticulitis is seen involving the
junction of the descending and sigmoid colon. No evidence of
extraluminal air or abscess.

Vascular/Lymphatic: No pathologically enlarged lymph nodes. No acute
vascular findings.

Reproductive: No mass or other significant abnormality. Clips are
seen from previous bilateral tubal ligation.

Other:  None.

Musculoskeletal:  No suspicious bone lesions identified.
IMPRESSION: Mild moderate diverticulitis involving the junction of the
descending and sigmoid colon. No evidence of abscess or other
complication.

## 2022-05-30 ENCOUNTER — Encounter: Payer: Self-pay | Admitting: *Deleted

## 2022-06-06 ENCOUNTER — Other Ambulatory Visit: Payer: Self-pay | Admitting: Family Medicine

## 2022-06-08 ENCOUNTER — Encounter: Payer: Self-pay | Admitting: Family Medicine

## 2022-06-17 ENCOUNTER — Ambulatory Visit (INDEPENDENT_AMBULATORY_CARE_PROVIDER_SITE_OTHER): Payer: 59 | Admitting: Family Medicine

## 2022-06-17 ENCOUNTER — Other Ambulatory Visit (HOSPITAL_COMMUNITY)
Admission: RE | Admit: 2022-06-17 | Discharge: 2022-06-17 | Disposition: A | Discharge status: Home / Self Care | Payer: 59 | Source: Ambulatory Visit | Attending: Family Medicine | Admitting: Family Medicine

## 2022-06-17 ENCOUNTER — Encounter: Payer: Self-pay | Admitting: Family Medicine

## 2022-06-17 ENCOUNTER — Ambulatory Visit: Payer: 59 | Admitting: Family Medicine

## 2022-06-17 VITALS — BP 90/64 | HR 76 | Temp 98.2°F | Ht 63.5 in | Wt 116.2 lb

## 2022-06-17 DIAGNOSIS — B3731 Acute candidiasis of vulva and vagina: Secondary | ICD-10-CM

## 2022-06-17 MED ORDER — FLUCONAZOLE 150 MG PO TABS: ORAL_TABLET | ORAL | 0 refills | Status: DC

## 2022-06-17 NOTE — Patient Instructions (Signed)
Please return in June 2024 for your annual complete physical; please come fasting.   If you have any questions or concerns, please don't hesitate to send me a message via MyChart or call the office at [phone]. Thank you for visiting with us today! It's our pleasure caring for you.  

## 2022-06-17 NOTE — Progress Notes (Signed)
Subjective  CC:  Chief Complaint  Patient presents with   possible yeast infection    Pt stated that she has a discharge    HPI: Shelley Williams is a 46 y.o. female who presents to the office today to address the problems listed above in the chief complaint. Patient presents for evaluation of an abnormal vaginal discharge: she describes  white without odor, pelvic pain.  + itching. Has h/o yeast and feels the same. LMP started yesterday. Sexually transmitted infection risk: Very low risk of STD exposure. . She declines serology testing and HIV testing today. She denies urinary sxs.  I reviewed the patients updated PMH, FH, and SocHx.    Patient Active Problem List   Diagnosis Date Noted   Seasonal allergic rhinitis due to pollen 02/11/2022   Family history of breast cancer in mother 07/18/2019   Atopic dermatitis 01/11/2019   Herpes simplex type 2 infection 01/01/2018   Nicotine dependence, cigarettes, uncomplicated 94/76/5465   Pruritus 01/01/2018   No outpatient medications have been marked as taking for the 06/17/22 encounter (Office Visit) with Leamon Arnt, MD.    Allergies: Patient has No Known Allergies. Family History: Patient family history includes Arthritis in her mother; Asthma in her mother; Breast cancer (age of onset: 66) in her mother; GER disease in her father; Glaucoma in her father; Healthy in her daughter and son; Heart disease in her maternal grandmother; Hyperlipidemia in her father; Hypertension in her father and mother; Lung cancer in her paternal grandfather; Pancreatic cancer in her father. Social History:  Patient  reports that she has been smoking cigarettes. She has been smoking an average of .5 packs per day. She has never used smokeless tobacco. She reports that she does not drink alcohol and does not use drugs.  Review of Systems: Constitutional: Negative for fever malaise or anorexia Cardiovascular: negative for chest pain Respiratory: negative  for SOB or persistent cough Gastrointestinal: negative for abdominal pain  Objective  Vitals: BP 90/64   Pulse 76   Temp 98.2 F (36.8 C)   Ht 5' 3.5" (1.613 m)   Wt 116 lb 3.2 oz (52.7 kg)   SpO2 99%   BMI 20.26 kg/m  General: no acute distress , A&Ox3  Assessment  1. Yeast vaginitis      Plan  Empirically treat:  await test results. Diflucan ordered  Follow up: June for cpe, 2024   Commons side effects, risks, benefits, and alternatives for medications and treatment plan prescribed today were discussed, and the patient expressed understanding of the given instructions. Patient is instructed to call or message via MyChart if he/she has any questions or concerns regarding our treatment plan. No barriers to understanding were identified. We discussed Red Flag symptoms and signs in detail. Patient expressed understanding regarding what to do in case of urgent or emergency type symptoms.  Medication list was reconciled, printed and provided to the patient in AVS. Patient instructions and summary information was reviewed with the patient as documented in the AVS. This note was prepared with assistance of Dragon voice recognition software. Occasional wrong-word or sound-a-like substitutions may have occurred due to the inherent limitations of voice recognition software  No orders of the defined types were placed in this encounter.  Meds ordered this encounter  Medications   fluconazole (DIFLUCAN) 150 MG tablet    Sig: Take one tablet today; may repeat in 3 days if symptoms persist    Dispense:  2 tablet  Refill:  0

## 2022-06-20 LAB — CERVICOVAGINAL ANCILLARY ONLY
Bacterial Vaginitis (gardnerella): NEGATIVE
Candida Glabrata: NEGATIVE
Candida Vaginitis: POSITIVE — AB
Comment: NEGATIVE
Comment: NEGATIVE
Comment: NEGATIVE

## 2022-07-26 ENCOUNTER — Encounter: Payer: Self-pay | Admitting: Gastroenterology

## 2022-08-03 ENCOUNTER — Encounter: Payer: 59 | Admitting: Family Medicine

## 2022-08-03 ENCOUNTER — Encounter: Payer: Self-pay | Admitting: Family Medicine

## 2022-08-03 ENCOUNTER — Ambulatory Visit (INDEPENDENT_AMBULATORY_CARE_PROVIDER_SITE_OTHER): Payer: 59

## 2022-08-03 DIAGNOSIS — Z23 Encounter for immunization: Secondary | ICD-10-CM | POA: Diagnosis not present

## 2022-08-04 NOTE — Progress Notes (Signed)
This encounter was created in error - please disregard.

## 2022-08-05 ENCOUNTER — Ambulatory Visit (AMBULATORY_SURGERY_CENTER): Payer: 59 | Admitting: *Deleted

## 2022-08-05 ENCOUNTER — Encounter: Payer: Self-pay | Admitting: Gastroenterology

## 2022-08-05 VITALS — Ht 63.5 in | Wt 120.0 lb

## 2022-08-05 DIAGNOSIS — Z1211 Encounter for screening for malignant neoplasm of colon: Secondary | ICD-10-CM

## 2022-08-05 MED ORDER — NA SULFATE-K SULFATE-MG SULF 17.5-3.13-1.6 GM/177ML PO SOLN: 1.0000 | Freq: Once | ORAL | 0 refills | Status: AC

## 2022-08-05 NOTE — Progress Notes (Signed)
No egg or soy allergy known to patient  No issues known to pt with past sedation with any surgeries or procedures Patient denies ever being told they had issues or difficulty with intubation  No FH of Malignant Hyperthermia Pt is not on diet pills Pt is not on home 02  Pt is not on blood thinners  Pt denies issues with constipation  No A fib or A flutter Have any cardiac testing pending--NO Pt instructed to use [email or web address] or GoodRx for a price reduction on prep   

## 2022-08-12 ENCOUNTER — Ambulatory Visit (AMBULATORY_SURGERY_CENTER): Payer: 59 | Admitting: Gastroenterology

## 2022-08-12 ENCOUNTER — Encounter: Payer: Self-pay | Admitting: Gastroenterology

## 2022-08-12 VITALS — BP 112/77 | HR 70 | Temp 97.8°F | Resp 12 | Ht 63.5 in | Wt 120.0 lb

## 2022-08-12 DIAGNOSIS — K5140 Inflammatory polyps of colon without complications: Secondary | ICD-10-CM

## 2022-08-12 DIAGNOSIS — Z1211 Encounter for screening for malignant neoplasm of colon: Secondary | ICD-10-CM

## 2022-08-12 DIAGNOSIS — D125 Benign neoplasm of sigmoid colon: Secondary | ICD-10-CM

## 2022-08-12 DIAGNOSIS — D123 Benign neoplasm of transverse colon: Secondary | ICD-10-CM

## 2022-08-12 MED ORDER — SODIUM CHLORIDE 0.9 % IV SOLN: 500.0000 mL | Freq: Once | INTRAVENOUS | Status: DC

## 2022-08-12 NOTE — Progress Notes (Signed)
Report to PACU, RN, vss, BBS= Clear.  

## 2022-08-12 NOTE — Op Note (Signed)
[facility] Patient Name: Shelley Williams Procedure Date: 08/12/2022 9:06 AM MRN: [id number] Endoscopist: Nicki Reaper E. Candis Schatz , MD, [id number] Age: 46 Referring MD:  Date of Birth: Jul 22, 1976 Gender: Female Account #: [id number] Procedure:                Colonoscopy Indications:              Screening for colorectal malignant neoplasm, This                            is the patient's first colonoscopy Medicines:                Monitored Anesthesia Care Procedure:                Pre-Anesthesia Assessment:                           - Prior to the procedure, a History and Physical                            was performed, and patient medications and                            allergies were reviewed. The patient's tolerance of                            previous anesthesia was also reviewed. The risks                            and benefits of the procedure and the sedation                            options and risks were discussed with the patient.                            All questions were answered, and informed consent                            was obtained. Prior Anticoagulants: The patient has                            taken no anticoagulant or antiplatelet agents. ASA                            Grade Assessment: I - A normal, healthy patient.                            After reviewing the risks and benefits, the patient                            was deemed in satisfactory condition to undergo the                            procedure.  After obtaining informed consent, the colonoscope                            was passed under direct vision. Throughout the                            procedure, the patient's blood pressure, pulse, and                            oxygen saturations were monitored continuously. The                            Olympus CF-HQ190L ([id number]) Colonoscope was                            introduced through the anus and  advanced to the the                            terminal ileum, with identification of the                            appendiceal orifice and IC valve. The colonoscopy                            was performed without difficulty. The patient                            tolerated the procedure well. The quality of the                            bowel preparation was good. The terminal ileum,                            ileocecal valve, appendiceal orifice, and rectum                            were photographed. The bowel preparation used was                            SUPREP via split dose instruction. Scope In: 9:14:37 AM Scope Out: 9:32:20 AM Scope Withdrawal Time: 0 hours 12 minutes 25 seconds  Total Procedure Duration: 0 hours 17 minutes 43 seconds  Findings:                 The perianal and digital rectal examinations were                            normal. Pertinent negatives include normal                            sphincter tone and no palpable rectal lesions.                           A 4 mm polyp was found in the transverse colon. The  polyp was pedunculated. The polyp was removed with                            a cold snare. Resection and retrieval were                            complete. Estimated blood loss was minimal.                           A 2 mm polyp was found in the sigmoid colon. The                            polyp was sessile. The polyp was removed with a                            cold snare. Resection was complete, but the polyp                            tissue was not retrieved. Estimated blood loss was                            minimal.                           Many large-mouthed and small-mouthed diverticula                            were found in the sigmoid colon, descending colon,                            transverse colon and ascending colon. There was no                            evidence of diverticular bleeding.                            The exam was otherwise normal throughout the                            examined colon.                           The terminal ileum appeared normal.                           The retroflexed view of the distal rectum and anal                            verge was normal and showed no anal or rectal                            abnormalities. Complications:            No immediate complications. Estimated Blood Loss:     Estimated blood loss was minimal. Impression:               -  One 4 mm polyp in the transverse colon, removed                            with a cold snare. Resected and retrieved.                           - One 2 mm polyp in the sigmoid colon, removed with                            a cold snare. Complete resection. Polyp tissue not                            retrieved.                           - Severe diverticulosis in the sigmoid colon, in                            the descending colon, in the transverse colon and                            in the ascending colon. There was no evidence of                            diverticular bleeding.                           - The examined portion of the ileum was normal.                           - The distal rectum and anal verge are normal on                            retroflexion view. Recommendation:           - Patient has a contact number available for                            emergencies. The signs and symptoms of potential                            delayed complications were discussed with the                            patient. Return to normal activities tomorrow.                            Written discharge instructions were provided to the                            patient.                           - Resume previous diet.                           -  Continue present medications.                           - Await pathology results.                           - Repeat colonoscopy (date not yet  determined) for                            surveillance based on pathology results.                           - Recommend high fiber diet or fiber supplements to                            reduce risk of diverticular complications. Quran Vasco E. Candis Schatz, MD 08/12/2022 9:40:16 AM This report has been signed electronically.

## 2022-08-12 NOTE — Progress Notes (Signed)
Pt's states no medical or surgical changes since previsit or office visit. 

## 2022-08-12 NOTE — Patient Instructions (Addendum)
Patient has a contact number available for emergencies. The signs and symptoms of potential delayed complications were discussed with the patient. Return to normal activities tomorrow. Written discharge instructions were provided to the patient. - Resume previous diet. - Continue present medications. - Await pathology results. - Repeat colonoscopy (date not yet determined) for surveillance based on pathology results. - Recommend high fiber diet or fiber supplements to reduce risk of diverticular complications.  YOU HAD AN ENDOSCOPIC PROCEDURE TODAY AT [facility]:   Refer to the procedure report that was given to you for any specific questions about what was found during the examination.  If the procedure report does not answer your questions, please call your gastroenterologist to clarify.  If you requested that your care partner not be given the details of your procedure findings, then the procedure report has been included in a sealed envelope for you to review at your convenience later.  YOU SHOULD EXPECT: Some feelings of bloating in the abdomen. Passage of more gas than usual.  Walking can help get rid of the air that was put into your GI tract during the procedure and reduce the bloating. If you had a lower endoscopy (such as a colonoscopy or flexible sigmoidoscopy) you may notice spotting of blood in your stool or on the toilet paper. If you underwent a bowel prep for your procedure, you may not have a normal bowel movement for a few days.  Please Note:  You might notice some irritation and congestion in your nose or some drainage.  This is from the oxygen used during your procedure.  There is no need for concern and it should clear up in a day or so.  SYMPTOMS TO REPORT IMMEDIATELY:  Following lower endoscopy (colonoscopy or flexible sigmoidoscopy):  Excessive amounts of blood in the stool  Significant tenderness or worsening of abdominal pains  Swelling of the abdomen  that is new, acute  Fever of 100F or higher   For urgent or emergent issues, a gastroenterologist can be reached at any hour by calling [phone]. Do not use MyChart messaging for urgent concerns.    DIET:  We do recommend a small meal at first, but then you may proceed to your regular diet.  Drink plenty of fluids but you should avoid alcoholic beverages for 24 hours.  ACTIVITY:  You should plan to take it easy for the rest of today and you should NOT DRIVE or use heavy machinery until tomorrow (because of the sedation medicines used during the test).    FOLLOW UP: Our staff will call the number listed on your records the next business day following your procedure.  We will call around 7:15- 8:00 am to check on you and address any questions or concerns that you may have regarding the information given to you following your procedure. If we do not reach you, we will leave a message.     If any biopsies were taken you will be contacted by phone or by letter within the next 1-3 weeks.  Please call us at [phone] if you have not heard about the biopsies in 3 weeks.    SIGNATURES/CONFIDENTIALITY: You and/or your care partner have signed paperwork which will be entered into your electronic medical record.  These signatures attest to the fact that that the information above on your After Visit Summary has been reviewed and is understood.  Full responsibility of the confidentiality of this discharge information lies with you and/or your care-partner.

## 2022-08-12 NOTE — Progress Notes (Signed)
Called to room to assist during endoscopic procedure.  Patient ID and intended procedure confirmed with present staff. Received instructions for my participation in the procedure from the performing physician.  

## 2022-08-12 NOTE — Progress Notes (Signed)
[facility] History and Physical   Primary Care Physician:  Leamon Arnt, MD   Reason for Procedure:   Colon cancer screening  Plan:    Screening colonoscoph     HPI: Shelley Williams is a 46 y.o. female undergoing initial average risk screening colonoscopy.  She has no family history of colon cancer and no chronic GI symptoms.    Past Medical History:  Diagnosis Date   Allergy    POLLEN   Benign gestational thrombocytopenia ([place]) 01/01/2018   Family history of breast cancer in mother 07/18/2019   HSV (herpes simplex virus) infection    Postpartum depression 01/01/2018    Past Surgical History:  Procedure Laterality Date   MOUTH SURGERY     TUBAL LIGATION Bilateral 02/01/2015   Procedure: POST PARTUM TUBAL LIGATION;  Surgeon: [name], MD;  Location: [facility];  Service: Gynecology;  Laterality: Bilateral;   WISDOM TOOTH EXTRACTION      Prior to Admission medications   Medication Sig Start Date End Date Taking? Authorizing Provider  betamethasone dipropionate 0.05 % cream Apply topically 2 (two) times daily. For a week as needed. Do not use on your face 02/11/22  Yes Leamon Arnt, MD  ELDERBERRY PO Take by mouth.   Yes [provider]  OVER THE COUNTER MEDICATION [place]   Yes [provider]  triamcinolone cream (KENALOG) 0.1 % Apply 1 application topically 2 (two) times daily as needed. 04/07/21  Yes Leamon Arnt, MD    Current Outpatient Medications  Medication Sig Dispense Refill   betamethasone dipropionate 0.05 % cream Apply topically 2 (two) times daily. For a week as needed. Do not use on your face 45 g 0   ELDERBERRY PO Take by mouth.     OVER THE COUNTER MEDICATION [place]     triamcinolone cream (KENALOG) 0.1 % Apply 1 application topically 2 (two) times daily as needed. 45 g 2   Current Facility-Administered Medications  Medication Dose Route Frequency Provider Last Rate Last Admin   0.9 %  sodium chloride infusion  500  mL Intravenous Once Daryel November, MD        Allergies as of 08/12/2022   (No Known Allergies)    Family History  Problem Relation Age of Onset   Colon polyps Mother    Asthma Mother    Hypertension Mother    Arthritis Mother    Breast cancer Mother 27   Hypertension Father    GER disease Father    Glaucoma Father    Hyperlipidemia Father    Pancreatic cancer Father    Heart disease Maternal Grandmother    Lung cancer Paternal Grandfather    Healthy Daughter    Healthy Son    Colon cancer Neg Hx    Crohn's disease Neg Hx    Esophageal cancer Neg Hx    Ulcerative colitis Neg Hx    Stomach cancer Neg Hx    Uterine cancer Neg Hx     Social History   Socioeconomic History   Marital status: Single    Spouse name: Not on file   Number of children: 2   Years of education: Not on file   Highest education level: Not on file  Occupational History   Occupation: customer service, 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    Employer: [employer]  Tobacco Use   Smoking status: Former    Packs/day: 0.50    Types: Cigarettes    Quit date: 06/17/2022    Years  since quitting: 0.1    Passive exposure: Current   Smokeless tobacco: Never  Vaping Use   Vaping Use: Never used  Substance and Sexual Activity   Alcohol use: Yes    Comment: OCCASSIONALLY   Drug use: No   Sexual activity: Not Currently    Birth control/protection: Surgical  Other Topics Concern   Not on file  Social History Narrative   Not on file   Social Determinants of Health   Financial Resource Strain: Not on file  Food Insecurity: Not on file  Transportation Needs: Not on file  Physical Activity: Not on file  Stress: Not on file  Social Connections: Not on file  Intimate Partner Violence: Not on file    Review of Systems:  All other review of systems negative except as mentioned in the HPI.  Physical Exam: Vital signs BP 103/65   Pulse 70   Temp 97.8 F (36.6 C) (Temporal)   Ht 5' 3.5" (1.613 m)   Wt  120 lb (54.4 kg)   LMP 07/13/2022   SpO2 100%   BMI 20.92 kg/m   General:   Alert,  Well-developed, well-nourished, pleasant and cooperative in NAD Airway:  Mallampati 2 Lungs:  Clear throughout to auscultation.   Heart:  Regular rate and rhythm; no murmurs, clicks, rubs,  or gallops. Abdomen:  Soft, nontender and nondistended. Normal bowel sounds.   Neuro/Psych:  Normal mood and affect. A and O x 3   Shawny Borkowski E. Candis Schatz, MD [facility]

## 2022-08-15 ENCOUNTER — Telehealth: Payer: Self-pay

## 2022-08-15 NOTE — Telephone Encounter (Signed)
  Follow up Call-     08/12/2022    8:31 AM  Call back number  Post procedure Call Back phone  # [phone]  Permission to leave phone message Yes     Patient questions:  Do you have a fever, pain , or abdominal swelling? No. Pain Score  0 *  Have you tolerated food without any problems? Yes.    Have you been able to return to your normal activities? Yes.    Do you have any questions about your discharge instructions: Diet   No. Medications  No. Follow up visit  No.  Do you have questions or concerns about your Care? No.  Actions: * If pain score is 4 or above: No action needed, pain <4.

## 2022-08-17 ENCOUNTER — Encounter: Payer: Self-pay | Admitting: Gastroenterology

## 2022-08-17 NOTE — Progress Notes (Signed)
Shelley Williams,  The polyp that was removed and recovered in the transverse colon was an inflammatory polyp.  These types of polyps are not considered precancerous and do not require any follow-up.  You had another small polyp that was removed in the sigmoid colon but was not recovered.  By its appearance, it did not look like a precancerous polyp.  Even if it was a precancerous polyp, current guidelines recommend repeating a colonoscopy in 7 to 10 years.  If it was not precancerous, current guidelines recommend repeating in 10 years. I would recommend you repeat a colonoscopy in 10 years, but it would also be reasonable to repeat in 7 years if you preferred, given that the histology of the sigmoid polyp is not known.

## 2022-08-19 ENCOUNTER — Encounter: Payer: Self-pay | Admitting: Family Medicine

## 2022-08-23 NOTE — Telephone Encounter (Signed)
Placed in Providers box to sign.

## 2022-08-30 ENCOUNTER — Encounter: Payer: Self-pay | Admitting: Family Medicine

## 2022-08-31 ENCOUNTER — Other Ambulatory Visit: Payer: Self-pay

## 2022-08-31 MED ORDER — FLUCONAZOLE 150 MG PO TABS: 150.0000 mg | ORAL_TABLET | Freq: Once | ORAL | 0 refills | Status: AC

## 2022-08-31 NOTE — Telephone Encounter (Signed)
Patient requests RX for Diflucan for yeast infection (please see MyChart message)  [facility]  [id number] [phone] Fax: [phone]

## 2022-09-22 ENCOUNTER — Encounter: Payer: Self-pay | Admitting: Family Medicine

## 2022-09-27 ENCOUNTER — Other Ambulatory Visit: Payer: Self-pay | Admitting: Family Medicine

## 2022-09-27 ENCOUNTER — Encounter: Payer: Self-pay | Admitting: Family Medicine

## 2022-09-27 MED ORDER — BETAMETHASONE DIPROPIONATE 0.05 % EX CREA: TOPICAL_CREAM | Freq: Two times a day (BID) | CUTANEOUS | 0 refills | Status: DC

## 2022-09-29 ENCOUNTER — Encounter (INDEPENDENT_AMBULATORY_CARE_PROVIDER_SITE_OTHER): Payer: 59 | Admitting: Family Medicine

## 2022-09-29 DIAGNOSIS — H00021 Hordeolum internum right upper eyelid: Secondary | ICD-10-CM | POA: Diagnosis not present

## 2022-09-30 MED ORDER — ERYTHROMYCIN 5 MG/GM OP OINT: 1.0000 | TOPICAL_OINTMENT | Freq: Three times a day (TID) | OPHTHALMIC | 0 refills | Status: DC

## 2022-09-30 NOTE — Telephone Encounter (Signed)
A total of 8 minutes were spent by me to personally review the patient-generated inquiry, review patient records and data pertinent to assessment of the patient's problem, develop a management plan including generation of prescriptions and/or orders, and on subsequent communication with the patient through secure the MyChart portal service. There is no separately reported E/M service related to this service in the past 7 days nor does the patient have an upcoming soonest available appointment for this issue. This work was completed in less than 7 days.      The codes to be used for the E/M service are:  [phone] for five-10 minutes of time spent on the inquiry.    Reviewed her note and record. Pictures are c/w upper lid hordeolum.  Rec stopping steroid drops; sent in abx ointment.  F/u in office if not improving.

## 2022-12-19 ENCOUNTER — Encounter: Payer: Self-pay | Admitting: Family Medicine

## 2022-12-20 MED ORDER — FLUCONAZOLE 150 MG PO TABS: ORAL_TABLET | ORAL | 0 refills | Status: DC

## 2023-02-02 ENCOUNTER — Encounter: Payer: Self-pay | Admitting: Family Medicine

## 2023-02-06 ENCOUNTER — Other Ambulatory Visit: Payer: Self-pay | Admitting: Family Medicine

## 2023-02-06 MED ORDER — FLUCONAZOLE 150 MG PO TABS: ORAL_TABLET | ORAL | 0 refills | Status: DC

## 2023-02-06 NOTE — Telephone Encounter (Signed)
Patient is calling for an update. States going to [place] on Sunday and doesn't want to go there with a yeast infection.

## 2023-02-17 ENCOUNTER — Other Ambulatory Visit (HOSPITAL_COMMUNITY)
Admission: RE | Admit: 2023-02-17 | Discharge: 2023-02-17 | Disposition: A | Discharge status: Home / Self Care | Payer: Medicaid Other | Source: Ambulatory Visit | Attending: Family Medicine | Admitting: Family Medicine

## 2023-02-17 ENCOUNTER — Ambulatory Visit (INDEPENDENT_AMBULATORY_CARE_PROVIDER_SITE_OTHER): Payer: Medicaid Other | Admitting: Family Medicine

## 2023-02-17 ENCOUNTER — Encounter: Payer: Self-pay | Admitting: Family Medicine

## 2023-02-17 VITALS — BP 110/74 | HR 78 | Temp 98.2°F | Ht 63.5 in | Wt 140.2 lb

## 2023-02-17 DIAGNOSIS — L2084 Intrinsic (allergic) eczema: Secondary | ICD-10-CM

## 2023-02-17 DIAGNOSIS — Z0000 Encounter for general adult medical examination without abnormal findings: Secondary | ICD-10-CM

## 2023-02-17 DIAGNOSIS — Z124 Encounter for screening for malignant neoplasm of cervix: Secondary | ICD-10-CM | POA: Diagnosis not present

## 2023-02-17 DIAGNOSIS — K635 Polyp of colon: Secondary | ICD-10-CM

## 2023-02-17 DIAGNOSIS — Z87891 Personal history of nicotine dependence: Secondary | ICD-10-CM | POA: Diagnosis not present

## 2023-02-17 LAB — CBC WITH DIFFERENTIAL/PLATELET
Basophils Absolute: 0.1 10*3/uL (ref 0.0–0.1)
Basophils Relative: 1.1 % (ref 0.0–3.0)
Eosinophils Absolute: 0.4 10*3/uL (ref 0.0–0.7)
Eosinophils Relative: 6.6 % — ABNORMAL HIGH (ref 0.0–5.0)
HCT: 39.4 % (ref 36.0–46.0)
Hemoglobin: 12.8 g/dL (ref 12.0–15.0)
Lymphocytes Relative: 31.3 % (ref 12.0–46.0)
Lymphs Abs: 1.7 10*3/uL (ref 0.7–4.0)
MCHC: 32.6 g/dL (ref 30.0–36.0)
MCV: 102.9 fl — ABNORMAL HIGH (ref 78.0–100.0)
Monocytes Absolute: 0.6 10*3/uL (ref 0.1–1.0)
Monocytes Relative: 10 % (ref 3.0–12.0)
Neutro Abs: 2.9 10*3/uL (ref 1.4–7.7)
Neutrophils Relative %: 51 % (ref 43.0–77.0)
Platelets: 207 10*3/uL (ref 150.0–400.0)
RBC: 3.83 Mil/uL — ABNORMAL LOW (ref 3.87–5.11)
RDW: 14.3 % (ref 11.5–15.5)
WBC: 5.6 10*3/uL (ref 4.0–10.5)

## 2023-02-17 LAB — COMPREHENSIVE METABOLIC PANEL
ALT: 17 U/L (ref 0–35)
AST: 23 U/L (ref 0–37)
Albumin: 4.4 g/dL (ref 3.5–5.2)
Alkaline Phosphatase: 47 U/L (ref 39–117)
BUN: 10 mg/dL (ref 6–23)
CO2: 28 mEq/L (ref 19–32)
Calcium: 9.6 mg/dL (ref 8.4–10.5)
Chloride: 103 mEq/L (ref 96–112)
Creatinine, Ser: 0.67 mg/dL (ref 0.40–1.20)
GFR: 104.67 mL/min (ref >60.00)
Glucose, Bld: 89 mg/dL (ref 70–99)
Potassium: 3.9 mEq/L (ref 3.5–5.1)
Sodium: 140 mEq/L (ref 135–145)
Total Bilirubin: 0.3 mg/dL (ref 0.2–1.2)
Total Protein: 7.3 g/dL (ref 6.0–8.3)

## 2023-02-17 LAB — LIPID PANEL
Cholesterol: 269 mg/dL — ABNORMAL HIGH (ref 0–200)
HDL: 96.3 mg/dL (ref >39.00)
LDL Cholesterol: 160 mg/dL — ABNORMAL HIGH (ref 0–99)
NonHDL: 172.49
Total CHOL/HDL Ratio: 3
Triglycerides: 60 mg/dL (ref 0.0–149.0)
VLDL: 12 mg/dL (ref 0.0–40.0)

## 2023-02-17 MED ORDER — TRIAMCINOLONE ACETONIDE 0.1 % EX CREA: 1.0000 | TOPICAL_CREAM | Freq: Two times a day (BID) | CUTANEOUS | 2 refills | Status: DC | PRN

## 2023-02-17 NOTE — Progress Notes (Signed)
Subjective  Chief Complaint  Patient presents with   Annual Exam    Pt here for Annual Exam and is currently     HPI: Shelley Williams is a 47 y.o. female who presents to [facility] [place] [facility] today for a Female Wellness Visit. She also has the concerns and/or needs as listed above in the chief complaint. These will be addressed in addition to the Health Maintenance Visit.   Wellness Visit: annual visit with health maintenance review and exam with Pap  HM: Doing well.  No complaints.  Quit smoking in October.  Acupuncture.  Mammogram is current.  Due Pap smear today.  Healthy lifestyle.  Reviewed recent colonoscopy from May 2023.  Hyperplastic colon.  Repeat 10 years.  Dr. Tomasa Rand. Chronic disease f/u and/or acute problem visit: (deemed necessary to be done in addition to the wellness visit): Intrinsic atopic dermatitis needs triamcinolone cream for flare on left palm.  Assessment  1. Annual physical exam   2. Cervical cancer screening   3. Hyperplastic colonic polyp, unspecified part of colon   4. Former smoker   5. Intrinsic atopic dermatitis      Plan  Female Wellness Visit: Age appropriate Health Maintenance and Prevention measures were discussed with patient. Included topics are cancer screening recommendations, ways to keep healthy (see AVS) including dietary and exercise recommendations, regular eye and dental care, use of seat belts, and avoidance of moderate alcohol use and tobacco use.  BMI: discussed patient's BMI and encouraged positive lifestyle modifications to help get to or maintain a target BMI. HM needs and immunizations were addressed and ordered. See below for orders. See HM and immunization section for updates. Routine labs and screening tests ordered including cmp, cbc and lipids where appropriate. Discussed recommendations regarding Vit D and calcium supplementation (see AVS)  Chronic disease management visit and/or acute problem  visit: Refill triamcinolone Follow up: 1 year for complete physical Orders Placed This Encounter  Procedures   CBC with Differential/Platelet   Comp Met (CMET)   Lipid Profile   Meds ordered this encounter  Medications   triamcinolone cream (KENALOG) 0.1 %    Sig: Apply 1 Application topically 2 (two) times daily as needed.    Dispense:  45 g    Refill:  2      Body mass index is 24.45 kg/m. Wt Readings from Last 3 Encounters:  02/17/23 140 lb 3.2 oz (63.6 kg)  08/12/22 120 lb (54.4 kg)  08/05/22 120 lb (54.4 kg)     Patient Active Problem List   Diagnosis Date Noted   Colon polyp, hyperplastic 02/17/2023    Dr. Tomasa Rand, colonoscopy 2023; repeat in 10 years    Seasonal allergic rhinitis due to pollen 02/11/2022   Family history of breast cancer in mother 07/18/2019    Mom age 15, 2020    Atopic dermatitis 01/11/2019   Herpes simplex type 2 infection 01/01/2018    Rare outbreak      Former smoker 01/01/2018    Light smoker; quit 06/2022    Pruritus 01/01/2018   Health Maintenance  Topic Date Due   PAP SMEAR-Modifier  01/02/2023   COVID-19 Vaccine (1) 03/05/2023 (Originally 01/03/1977)   INFLUENZA VACCINE  04/06/2023   DTaP/Tdap/Td (4 - Td or Tdap) 11/17/2024   Colonoscopy  08/12/2032   Hepatitis C Screening  Completed   HIV Screening  Completed   HPV VACCINES  Aged Out   Immunization History  Administered Date(s) Administered  Hepatitis B, PED/ADOLESCENT 05/30/2014   Influenza, Seasonal, Injecte, Preservative Fre 06/05/2016   Influenza,inj,Quad PF,6+ Mos 08/03/2022   Influenza,inj,quad, With Preservative 06/05/2014   Tdap 09/05/2010, 06/03/2011, 11/18/2014   We updated and reviewed the patient's past history in detail and it is documented below. Allergies: Patient has No Known Allergies. Past Medical History Patient  has a past medical history of Allergy, Benign gestational thrombocytopenia ([facility]) (01/01/2018), Family history of breast cancer in  mother (07/18/2019), HSV (herpes simplex virus) infection, and Postpartum depression (01/01/2018). Past Surgical History Patient  has a past surgical history that includes Wisdom tooth extraction; Tubal ligation (Bilateral, 02/01/2015); and Mouth surgery. Family History: Patient family history includes Arthritis in her mother; Asthma in her mother; Breast cancer (age of onset: 71) in her mother; Colon polyps in her mother; GER disease in her father; Glaucoma in her father; Healthy in her daughter and son; Heart disease in her maternal grandmother; Hyperlipidemia in her father; Hypertension in her father and mother; Lung cancer in her paternal grandfather; Pancreatic cancer in her father. Social History:  Patient  reports that she quit smoking about 8 months ago. Her smoking use included cigarettes. She smoked an average of .5 packs per day. She has been exposed to tobacco smoke. She has never used smokeless tobacco. She reports current alcohol use. She reports that she does not use drugs.  Review of Systems: Constitutional: negative for fever or malaise Ophthalmic: negative for photophobia, double vision or loss of vision Cardiovascular: negative for chest pain, dyspnea on exertion, or new LE swelling Respiratory: negative for SOB or persistent cough Gastrointestinal: negative for abdominal pain, change in bowel habits or melena Genitourinary: negative for dysuria or gross hematuria, no abnormal uterine bleeding or disharge Musculoskeletal: negative for new gait disturbance or muscular weakness Integumentary: negative for new or persistent rashes, no breast lumps Neurological: negative for TIA or stroke symptoms Psychiatric: negative for SI or delusions Allergic/Immunologic: negative for hives  Patient Care Team    Relationship Specialty Notifications Start End  Willow Ora, MD PCP - General Family Medicine  01/01/18     Objective  Vitals: BP 110/74   Pulse 78   Temp 98.2 F (36.8 C)    Ht 5' 3.5" (1.613 m)   Wt 140 lb 3.2 oz (63.6 kg)   SpO2 99%   BMI 24.45 kg/m  General:  Well developed, well nourished, no acute distress  Psych:  Alert and orientedx3,normal mood and affect HEENT:  Normocephalic, atraumatic, non-icteric sclera,  supple neck without adenopathy, mass or thyromegaly Cardiovascular:  Normal S1, S2, RRR without gallop, rub or murmur Respiratory:  Good breath sounds bilaterally, CTAB with normal respiratory effort Gastrointestinal: normal bowel sounds, soft, non-tender, no noted masses. No HSM MSK: extremities without edema, joints without erythema or swelling Neurologic:    Mental status is normal.  Gross motor and sensory exams are normal.  No tremor Pelvic Exam: Normal external genitalia, no vulvar or vaginal lesions present.  Clear vaginal thin discharge present.  Clear cervix w/o CMT. Bimanual exam reveals a nontender fundus w/o masses, nl size. No adnexal masses present. No inguinal adenopathy. A PAP smear was performed.   Commons side effects, risks, benefits, and alternatives for medications and treatment plan prescribed today were discussed, and the patient expressed understanding of the given instructions. Patient is instructed to call or message via MyChart if he/she has any questions or concerns regarding our treatment plan. No barriers to understanding were identified. We discussed Red Flag symptoms and  signs in detail. Patient expressed understanding regarding what to do in case of urgent or emergency type symptoms.  Medication list was reconciled, printed and provided to the patient in AVS. Patient instructions and summary information was reviewed with the patient as documented in the AVS. This note was prepared with assistance of Dragon voice recognition software. Occasional wrong-word or sound-a-like substitutions may have occurred due to the inherent limitations of voice recognition software

## 2023-02-20 NOTE — Progress Notes (Signed)
Labs reviewed.  The 10-year ASCVD risk score (Arnett DK, et al., 2019) is: 0.3%   Values used to calculate the score:     Age: 47 years     Sex: Female     Is Non-Hispanic African American: Yes     Diabetic: No     Tobacco smoker: No     Systolic Blood Pressure: 110 mmHg     Is BP treated: No     HDL Cholesterol: 96.3 mg/dL     Total Cholesterol: 269 mg/dL

## 2023-02-21 LAB — CYTOLOGY - PAP
Adequacy: ABSENT
Comment: NEGATIVE
Comment: NEGATIVE
Comment: NEGATIVE
Diagnosis: NEGATIVE
HPV 16: NEGATIVE
HPV 18 / 45: NEGATIVE
High risk HPV: POSITIVE — AB

## 2023-03-01 NOTE — Progress Notes (Signed)
See mychart note Dear Ms. Hickel, Your pap test shows HPV but the pap is normal. You have had this before and the risk of anything serious is low. We will recheck in 3 years.  Sincerely, Dr. Mardelle Matte

## 2023-03-13 ENCOUNTER — Ambulatory Visit: Payer: Medicaid Other | Admitting: Family Medicine

## 2023-03-13 ENCOUNTER — Ambulatory Visit (INDEPENDENT_AMBULATORY_CARE_PROVIDER_SITE_OTHER): Payer: Medicaid Other

## 2023-03-13 VITALS — BP 102/72 | HR 77 | Ht 63.5 in | Wt 137.2 lb

## 2023-03-13 DIAGNOSIS — R0781 Pleurodynia: Secondary | ICD-10-CM

## 2023-03-13 NOTE — Progress Notes (Signed)
   Shelley Payor, PhD, LAT, ATC acting as a scribe for Shelley Graham, MD.  Shelley Williams is a 47 y.o. female who presents to [facility] today for rib pain ongoing since the 4th of July. MOI: She was at the [employer] and was reaching to catch a free t-shirt hitting her rib cage on the arm rest. Pt locates pain to the R-side of her rib cage.  Aggravates: deep inspiration, worse in the mornings Treatments tried: heat, ice,   Pertinent review of systems: No fevers or chills.  Relevant historical information: Atopic dermatitis   Exam:  BP 102/72   Pulse 77   Ht 5' 3.5" (1.613 m)   Wt 137 lb 3.2 oz (62.2 kg)   SpO2 97%   BMI 23.92 kg/m  General: Well Developed, well nourished, and in no acute distress.   MSK: Right ribs slight bruising anterior lateral lower ribs.  Tender palpation.  Normal motion.    Lab and Radiology Results  X-ray images right ribs and chest obtained today personally and independently interpreted. No acute fractures are visible.  No pneumothorax or pleural effusion present. Await formal radiology review     Assessment and Plan: 47 y.o. female with right chest wall or rib contusion.  No acute fractures are visible on my interpretation of the x-ray.  Radiology overread is still pending. Plan for rib binder heating pad Tylenol ibuprofen.  Consider muscle relaxers if needed. Check back as needed.   PDMP not reviewed this encounter. Orders Placed This Encounter  Procedures   DG Ribs Unilateral W/Chest Right    Standing Status:   Future    Number of Occurrences:   1    Standing Expiration Date:   04/13/2023    Order Specific Question:   Reason for Exam (SYMPTOM  OR DIAGNOSIS REQUIRED)    Answer:   right sided rib pain    Order Specific Question:   Preferred imaging location?    Answer:   [facility]    Order Specific Question:   Is patient pregnant?    Answer:   No   No orders of the defined types were  placed in this encounter.    Discussed warning signs or symptoms. Please see discharge instructions. Patient expresses understanding.   The above documentation has been reviewed and is accurate and complete Shelley Williams, M.D.

## 2023-03-13 NOTE — Patient Instructions (Addendum)
Thank you for coming in today.   Consider a rib binder.   Heat may help.   OK to use tylenol and ibuprofen   I can prescribe a muscle relaxer if needed.   Recheck as needed.

## 2023-03-17 ENCOUNTER — Encounter: Payer: Self-pay | Admitting: Family Medicine

## 2023-03-17 NOTE — Progress Notes (Signed)
Rib and chest x-ray looks okay.  No fractures are visible.

## 2023-03-20 ENCOUNTER — Other Ambulatory Visit: Payer: Self-pay

## 2023-03-20 DIAGNOSIS — R0781 Pleurodynia: Secondary | ICD-10-CM

## 2023-03-20 MED ORDER — CYCLOBENZAPRINE HCL 5 MG PO TABS
5.0000 mg | ORAL_TABLET | Freq: Every day | ORAL | 0 refills | Status: DC
Start: 2023-03-20 — End: 2024-02-26

## 2023-05-23 ENCOUNTER — Encounter: Payer: Self-pay | Admitting: Family Medicine

## 2023-05-23 ENCOUNTER — Telehealth: Payer: Self-pay | Admitting: Family Medicine

## 2023-05-23 NOTE — Telephone Encounter (Signed)
Patient sent in an appointment request and placed this in the notes:   "Is my pap with HPV screening overdue? I recvd a msg in my chart." Please Advise.

## 2023-05-24 ENCOUNTER — Other Ambulatory Visit: Payer: Self-pay

## 2023-05-24 DIAGNOSIS — B3731 Acute candidiasis of vulva and vagina: Secondary | ICD-10-CM

## 2023-05-24 MED ORDER — FLUCONAZOLE 150 MG PO TABS
ORAL_TABLET | ORAL | 1 refills | Status: DC
Start: 2023-05-24 — End: 2023-09-12

## 2023-06-17 ENCOUNTER — Encounter: Payer: Self-pay | Admitting: Family Medicine

## 2023-06-19 ENCOUNTER — Other Ambulatory Visit: Payer: Self-pay

## 2023-06-19 DIAGNOSIS — L309 Dermatitis, unspecified: Secondary | ICD-10-CM

## 2023-06-21 MED ORDER — TRIAMCINOLONE ACETONIDE 0.1 % EX CREA: 1.0000 | TOPICAL_CREAM | Freq: Two times a day (BID) | CUTANEOUS | 2 refills | Status: DC | PRN

## 2023-09-08 ENCOUNTER — Encounter: Payer: Self-pay | Admitting: Family Medicine

## 2023-09-08 DIAGNOSIS — B3731 Acute candidiasis of vulva and vagina: Secondary | ICD-10-CM

## 2023-09-12 MED ORDER — FLUCONAZOLE 150 MG PO TABS
ORAL_TABLET | ORAL | 1 refills | Status: DC
Start: 2023-09-12 — End: 2023-11-06

## 2023-09-12 MED ORDER — TRIAMCINOLONE ACETONIDE 0.1 % EX CREA: 1.0000 | TOPICAL_CREAM | Freq: Two times a day (BID) | CUTANEOUS | 2 refills | Status: AC | PRN

## 2023-10-31 ENCOUNTER — Ambulatory Visit: Payer: Self-pay | Admitting: Family Medicine

## 2023-10-31 NOTE — Telephone Encounter (Signed)
 Chief Complaint: Cough Symptoms: Productive cough, white sputum, chest congestion, nasal discharge Frequency: hourly Pertinent Negatives: Patient denies fever, SOB, chest pain, nasal congestion, n/v,  Disposition: [] ED /[] Urgent Care (no appt availability in office) / [x] Appointment(In office/virtual)/ []  [facility]/ [] Home Care/ [] Refused Recommended Disposition /[] [facility]/ []  Follow-up with PCP Additional Notes: Patient called with complaints of cough of over 2 weeks. Patient states she and her kids got sick with flu-like/COVID-like symptoms and initially presented with chills, body aches, weakness, and cough. Patient states that her kids have stopped coughing but she still has a mild, "murky" cough that is productive with white sputum after 2 weeks. Patient state that she has taken mucinex, cough syrup, and allergy medication but cough remains. Patient states that cough is worse in the morning but will level out throughout the day and has a coughing episode about 3 times an hour. Patient states her chest is congested and she "can feel it about to come on" however was not tested for COVID or Flu. Patient denies current fever, body aches, chills, sore throat, head congestion. Patient advised by this RN to be seen within 3 days per protocol, to which patient was agreeable. Appt scheduled. Patient advised by this RN to call back with worsening symptoms. Patient verbalized understanding.   Copied from CRM [id number]. Topic: Clinical - Medical Advice >> Oct 31, 2023 10:16 AM Alcus Dad wrote: Reason for CRM: Patient stated she had the flu about 2 weeks ago.. patient had dry cough during that time. Patient is now coughing up mucus. Reason for Disposition  Cough has been present for > 3 weeks  Answer Assessment - Initial Assessment Questions 1. ONSET: "When did the cough begin?"      2 weeks 2. SEVERITY: "How bad is the cough today?"      Mild 3. SPUTUM: "Describe the color of  your sputum" (none, dry cough; clear, white, yellow, green)     White 4. HEMOPTYSIS: "Are you coughing up any blood?" If so ask: "How much?" (flecks, streaks, tablespoons, etc.)     Denies 5. DIFFICULTY BREATHING: "Are you having difficulty breathing?" If Yes, ask: "How bad is it?" (e.g., mild, moderate, severe)    - MILD: No SOB at rest, mild SOB with walking, speaks normally in sentences, can lie down, no retractions, pulse < 100.    - MODERATE: SOB at rest, SOB with minimal exertion and prefers to sit, cannot lie down flat, speaks in phrases, mild retractions, audible wheezing, pulse 100-120.    - SEVERE: Very SOB at rest, speaks in single words, struggling to breathe, sitting hunched forward, retractions, pulse > 120      Denies 6. FEVER: "Do you have a fever?" If Yes, ask: "What is your temperature, how was it measured, and when did it start?"     Denies 7. CARDIAC HISTORY: "Do you have any history of heart disease?" (e.g., heart attack, congestive heart failure)      Denies 8. LUNG HISTORY: "Do you have any history of lung disease?"  (e.g., pulmonary embolus, asthma, emphysema)     Denies 9. PE RISK FACTORS: "Do you have a history of blood clots?" (or: recent major surgery, recent prolonged travel, bedridden)     Denies 10. OTHER SYMPTOMS: "Do you have any other symptoms?" (e.g., runny nose, wheezing, chest pain)       Chest congestion, chills, "My nose might run a little bit in the morning, but it's not anything out of the  normal."    Took mucinex, allergy medication, cough syrup  Protocols used: Cough - Acute Productive-A-AH

## 2023-11-02 ENCOUNTER — Ambulatory Visit: Payer: Medicaid Other | Admitting: Family Medicine

## 2023-11-06 ENCOUNTER — Ambulatory Visit: Payer: Medicaid Other | Admitting: Family Medicine

## 2023-11-06 ENCOUNTER — Encounter: Payer: Self-pay | Admitting: Family Medicine

## 2023-11-06 VITALS — BP 102/68 | HR 71 | Temp 97.8°F | Ht 63.5 in | Wt 129.6 lb

## 2023-11-06 DIAGNOSIS — B3731 Acute candidiasis of vulva and vagina: Secondary | ICD-10-CM

## 2023-11-06 DIAGNOSIS — B9689 Other specified bacterial agents as the cause of diseases classified elsewhere: Secondary | ICD-10-CM

## 2023-11-06 DIAGNOSIS — J208 Acute bronchitis due to other specified organisms: Secondary | ICD-10-CM | POA: Diagnosis not present

## 2023-11-06 DIAGNOSIS — R051 Acute cough: Secondary | ICD-10-CM | POA: Diagnosis not present

## 2023-11-06 MED ORDER — AZITHROMYCIN 250 MG PO TABS: ORAL_TABLET | ORAL | 0 refills | Status: DC

## 2023-11-06 MED ORDER — FLUCONAZOLE 150 MG PO TABS: ORAL_TABLET | ORAL | 1 refills | Status: DC

## 2023-11-06 NOTE — Patient Instructions (Addendum)
 Please follow up as scheduled for your next visit with me: 02/19/2024   If you have any questions or concerns, please don't hesitate to send me a message via MyChart or call the office at [phone]. Thank you for visiting with [place] today! It's our pleasure caring for you.   VISIT SUMMARY:  Today, we discussed your lingering cough that has persisted for 3-4 weeks following a recent flu-like illness. You are otherwise feeling well, with no new symptoms such as fever or sore throat. Your lungs are clear upon examination.  YOUR PLAN:  -POST-INFLAMMATORY COUGH/secondary bronchitis: A post-inflammatory cough is a lingering cough that occurs after an illness, such as the flu, due to inflammation in the airways. As well, you have a productive cough that could be related to infection. To help with your symptoms, you should start taking Mucinex DM, which is available over the counter, to thin the mucus and reduce coughing. I've ordered an antibiotic to take as well since there is a potential bacterial infection, and we will provide medication to prevent a yeast infection, which can be a side effect of antibiotics.  INSTRUCTIONS:  Please start taking Mucinex DM as directed. If your symptoms do not improve or if they worsen, contact our office. Your next physical is scheduled for July.

## 2023-11-06 NOTE — Progress Notes (Signed)
 Subjective  CC:  Chief Complaint  Patient presents with   Cough    Pt stated that she has been coughing for the past 2 weeks     HPI: Shelley Williams is a 48 y.o. female who presents to the office today to address the problems listed above in the chief complaint. Discussed the use of AI scribe software for clinical note transcription with the patient, who gave verbal consent to proceed.  History of Present Illness   Shelley Williams is a 48 year old female who presents with a lingering cough following a recent illness.  Approximately three to four weeks ago, she and her family experienced flu-like symptoms, including congestion and a dry cough, which lasted about a week. Her family has fully recovered, but she continues to experience a lingering cough.  The cough has now become productive, with a small amount of white, foggy mucus. She feels otherwise well and reports no new fever, sore throat, or significant sneezing. The cough is noticeable but does not affect her sleep.  She has not taken any medication for the cough, although she has Mucinex at home but has not used it yet.  Cough is not improving and mucous is thick. Cough is harsh. No myalgias, sob or pleuritic chest pain      Assessment  1. Acute bacterial bronchitis   2. Acute cough   3. Yeast vaginitis      Plan  Assessment and Plan    Post-Inflammatory Cough Lingering cough with some mucus production for 3-4 weeks following a flu-like illness. No current systemic symptoms. Lungs clear on auscultation. -Start Mucinex DM over the counter to thin mucus and help with coughing. -zpak to address potential bacterial superinfection. -Provide yeast infection prophylaxis due to potential side effect of antibiotics.  Follow-up Physical scheduled for June.        No orders of the defined types were placed in this encounter.  Meds ordered this encounter  Medications   azithromycin (ZITHROMAX) 250 MG tablet    Sig: Take 2  tabs today, then 1 tab daily for 4 days    Dispense:  1 each    Refill:  0   fluconazole (DIFLUCAN) 150 MG tablet    Sig: Take one tablet today; may repeat in 3 days if symptoms persist    Dispense:  2 tablet    Refill:  1     I reviewed the patients updated PMH, FH, and SocHx.    Patient Active Problem List   Diagnosis Date Noted   Colon polyp, hyperplastic 02/17/2023   Seasonal allergic rhinitis due to pollen 02/11/2022   Family history of breast cancer in mother 07/18/2019   Atopic dermatitis 01/11/2019   Herpes simplex type 2 infection 01/01/2018   Former smoker 01/01/2018   Pruritus 01/01/2018   Current Meds  Medication Sig   azithromycin (ZITHROMAX) 250 MG tablet Take 2 tabs today, then 1 tab daily for 4 days   cyclobenzaprine (FLEXERIL) 5 MG tablet Take 1 tablet (5 mg total) by mouth at bedtime.   ELDERBERRY PO Take by mouth.   OVER THE COUNTER MEDICATION [place]   triamcinolone cream (KENALOG) 0.1 % Apply 1 Application topically 2 (two) times daily as needed.   [DISCONTINUED] fluconazole (DIFLUCAN) 150 MG tablet Take one tablet today; may repeat in 3 days if symptoms persist    Allergies: Patient has no known allergies. Family History: Patient family history includes Arthritis in her mother; Asthma in her mother;  Breast cancer (age of onset: 75) in her mother; Colon polyps in her mother; GER disease in her father; Glaucoma in her father; Healthy in her daughter and son; Heart disease in her maternal grandmother; Hyperlipidemia in her father; Hypertension in her father and mother; Lung cancer in her paternal grandfather; Pancreatic cancer in her father. Social History:  Patient  reports that she quit smoking about 16 months ago. Her smoking use included cigarettes. She has been exposed to tobacco smoke. She has never used smokeless tobacco. She reports current alcohol use. She reports that she does not use drugs.  Review of Systems: Constitutional: Negative for fever  malaise or anorexia Cardiovascular: negative for chest pain Respiratory: negative for SOB or persistent cough Gastrointestinal: negative for abdominal pain  Objective  Vitals: BP 102/68   Pulse 71   Temp 97.8 F (36.6 C)   Ht 5' 3.5" (1.613 m)   Wt 129 lb 9.6 oz (58.8 kg)   SpO2 100%   BMI 22.60 kg/m  General: no acute distress , A&Ox3, non toxic but harsh thick chest cough HEENT: PEERL, conjunctiva normal, neck is supple Cardiovascular:  RRR without murmur or gallop.  Respiratory:  Good breath sounds bilaterally, CTAB with normal respiratory effort Skin:  Warm, no rashes  Commons side effects, risks, benefits, and alternatives for medications and treatment plan prescribed today were discussed, and the patient expressed understanding of the given instructions. Patient is instructed to call or message via MyChart if he/she has any questions or concerns regarding our treatment plan. No barriers to understanding were identified. We discussed Red Flag symptoms and signs in detail. Patient expressed understanding regarding what to do in case of urgent or emergency type symptoms.  Medication list was reconciled, printed and provided to the patient in AVS. Patient instructions and summary information was reviewed with the patient as documented in the AVS. This note was prepared with assistance of Dragon voice recognition software. Occasional wrong-word or sound-a-like substitutions may have occurred due to the inherent limitations of voice recognition software

## 2023-12-12 ENCOUNTER — Other Ambulatory Visit: Payer: Self-pay | Admitting: Family Medicine

## 2023-12-12 DIAGNOSIS — Z1231 Encounter for screening mammogram for malignant neoplasm of breast: Secondary | ICD-10-CM

## 2023-12-13 ENCOUNTER — Ambulatory Visit
Admission: RE | Admit: 2023-12-13 | Discharge: 2023-12-13 | Discharge status: Home / Self Care | Source: Ambulatory Visit | Attending: Family Medicine | Admitting: Family Medicine

## 2023-12-13 DIAGNOSIS — Z1231 Encounter for screening mammogram for malignant neoplasm of breast: Secondary | ICD-10-CM | POA: Diagnosis not present

## 2024-02-19 ENCOUNTER — Encounter: Payer: Medicaid Other | Admitting: Family Medicine

## 2024-02-26 ENCOUNTER — Ambulatory Visit (INDEPENDENT_AMBULATORY_CARE_PROVIDER_SITE_OTHER): Admitting: Family Medicine

## 2024-02-26 ENCOUNTER — Encounter: Payer: Self-pay | Admitting: Family Medicine

## 2024-02-26 VITALS — BP 111/79 | HR 75 | Temp 98.1°F | Ht 63.5 in | Wt 130.0 lb

## 2024-02-26 DIAGNOSIS — F17290 Nicotine dependence, other tobacco product, uncomplicated: Secondary | ICD-10-CM

## 2024-02-26 DIAGNOSIS — Z803 Family history of malignant neoplasm of breast: Secondary | ICD-10-CM | POA: Diagnosis not present

## 2024-02-26 DIAGNOSIS — Z0000 Encounter for general adult medical examination without abnormal findings: Secondary | ICD-10-CM | POA: Diagnosis not present

## 2024-02-26 LAB — CBC WITH DIFFERENTIAL/PLATELET
Basophils Absolute: 0 10*3/uL (ref 0.0–0.1)
Basophils Relative: 0.8 % (ref 0.0–3.0)
Eosinophils Absolute: 0.2 10*3/uL (ref 0.0–0.7)
Eosinophils Relative: 4 % (ref 0.0–5.0)
HCT: 43 % (ref 36.0–46.0)
Hemoglobin: 14.4 g/dL (ref 12.0–15.0)
Lymphocytes Relative: 35.4 % (ref 12.0–46.0)
Lymphs Abs: 2.2 10*3/uL (ref 0.7–4.0)
MCHC: 33.5 g/dL (ref 30.0–36.0)
MCV: 99.9 fl (ref 78.0–100.0)
Monocytes Absolute: 0.6 10*3/uL (ref 0.1–1.0)
Monocytes Relative: 9.2 % (ref 3.0–12.0)
Neutro Abs: 3.1 10*3/uL (ref 1.4–7.7)
Neutrophils Relative %: 50.6 % (ref 43.0–77.0)
Platelets: 196 10*3/uL (ref 150.0–400.0)
RBC: 4.3 Mil/uL (ref 3.87–5.11)
RDW: 13.8 % (ref 11.5–15.5)
WBC: 6.1 10*3/uL (ref 4.0–10.5)

## 2024-02-26 LAB — COMPREHENSIVE METABOLIC PANEL WITH GFR
ALT: 14 U/L (ref 0–35)
AST: 22 U/L (ref 0–37)
Albumin: 4.7 g/dL (ref 3.5–5.2)
Alkaline Phosphatase: 54 U/L (ref 39–117)
BUN: 13 mg/dL (ref 6–23)
CO2: 22 meq/L (ref 19–32)
Calcium: 9.8 mg/dL (ref 8.4–10.5)
Chloride: 105 meq/L (ref 96–112)
Creatinine, Ser: 0.74 mg/dL (ref 0.40–1.20)
GFR: 96.2 mL/min (ref >60.00)
Glucose, Bld: 84 mg/dL (ref 70–99)
Potassium: 4.4 meq/L (ref 3.5–5.1)
Sodium: 136 meq/L (ref 135–145)
Total Bilirubin: 0.3 mg/dL (ref 0.2–1.2)
Total Protein: 8.2 g/dL (ref 6.0–8.3)

## 2024-02-26 LAB — TSH: TSH: 1.24 u[IU]/mL (ref 0.35–5.50)

## 2024-02-26 LAB — LIPID PANEL
Cholesterol: 267 mg/dL — ABNORMAL HIGH (ref 0–200)
HDL: 74.2 mg/dL (ref >39.00)
LDL Cholesterol: 170 mg/dL — ABNORMAL HIGH (ref 0–99)
NonHDL: 192.76
Total CHOL/HDL Ratio: 4
Triglycerides: 113 mg/dL (ref 0.0–149.0)
VLDL: 22.6 mg/dL (ref 0.0–40.0)

## 2024-02-26 MED ORDER — VARENICLINE TARTRATE (STARTER) 0.5 MG X 11 & 1 MG X 42 PO TBPK: ORAL_TABLET | ORAL | 0 refills | Status: AC

## 2024-02-26 NOTE — Patient Instructions (Addendum)
 Please return in 12 months for your annual complete physical; please come fasting.   I will release your lab results to you on your MyChart account with further instructions. You may see the results before I do, but when I review them I will send you a message with my report or have my assistant call you if things need to be discussed. Please reply to my message with any questions. Thank you!   If you have any questions or concerns, please don't hesitate to send me a message via MyChart or call the office at [phone]. Thank you for visiting with us  today! It's our pleasure caring for you.   Please do these things to maintain good health!  Exercise at least 30-45 minutes a day,  4-5 days a week.  Eat a low-fat diet with lots of fruits and vegetables, up to 7-9 servings per day. Drink plenty of water daily. Try to drink 8 8oz glasses per day. Seatbelts can save your life. Always wear your seatbelt. Place Smoke Detectors on every level of your home and check batteries every year. Schedule an appointment with an eye doctor for an eye exam every 1-2 years Safe sex - use condoms to protect yourself from STDs if you could be exposed to these types of infections. Use birth control if you do not want to become pregnant and are sexually active. Avoid heavy alcohol use. If you drink, keep it to less than 2 drinks/day and not every day. Health Care Power of Attorney.  Choose someone you trust that could speak for you if you became unable to speak for yourself. Depression is common in our stressful world.If you're feeling down or losing interest in things you normally enjoy, please come in for a visit. If anyone is threatening or hurting you, please get help. Physical or Emotional Violence is never OK.   Calcium Intake Recommendations You can take Caltrate Plus twice a day or get it through your diet or other OTC supplements (Viactiv, OsCal etc)  Calcium is a mineral that affects many functions in the  body, including: Blood clotting. Blood vessel function. Nerve impulse conduction. Hormone secretion. Muscle contraction. Bone and teeth functions.  Most of your body's calcium supply is stored in your bones and teeth. When your calcium stores are low, you may be at risk for low bone mass, bone loss, and bone fractures. Consuming enough calcium helps to grow healthy bones and teeth and to prevent breakdown over time. It is very important that you get enough calcium if you are: A child undergoing rapid growth. An adolescent girl. A pre- or post-menopausal woman. A woman whose menstrual cycle has stopped due to anorexia nervosa or regular intense exercise. An individual with lactose intolerance or a milk allergy. A vegetarian.  What is my plan? Try to consume the recommended amount of calcium daily based on your age. Depending on your overall health, your health care provider may recommend increased calcium intake. General daily calcium intake recommendations by age are: Birth to 6 months: 200 mg. Infants 7 to 12 months: 260 mg. Children 1 to 3 years: 700 mg. Children 4 to 8 years: 1,000 mg. Children 9 to 13 years: 1,300 mg. Teens 14 to 18 years: 1,300 mg. Adults 19 to 50 years: 1,000 mg. Adult women 51 to 70 years: 1,200 mg. Adult men 51 to 70 years: 1,000 mg. Adults 71 years and older: 1,200 mg. Pregnant and breastfeeding teens: 1,300 mg. Pregnant and breastfeeding adults: 1,000 mg.  What do I need to know about calcium intake? In order for the body to absorb calcium, it needs vitamin D. You can get vitamin D through (we recommend getting [phone] units of Vitamin D daily) Direct exposure of the skin to sunlight. Foods, such as egg yolks, liver, saltwater fish, and fortified milk. Supplements. Consuming too much calcium may cause: Constipation. Decreased absorption of iron and zinc. Kidney stones. Calcium supplements may interact with certain medicines. Check with your health  care provider before starting any calcium supplements. Try to get most of your calcium from food. What foods can I eat? Grains  Fortified oatmeal. Fortified ready-to-eat cereals. Fortified frozen waffles. Vegetables Turnip greens. Broccoli. Fruits Fortified orange juice. Meats and Other Protein Sources Canned sardines with bones. Canned salmon with bones. Soy beans. Tofu. Baked beans. Almonds. Brazil nuts. Sunflower seeds. Dairy Milk. Yogurt. Cheese. Cottage cheese. Beverages Fortified soy milk. Fortified rice milk. Sweets/Desserts Pudding. Ice Cream. Milkshakes. Blackstrap molasses. The items listed above may not be a complete list of recommended foods or beverages. Contact your dietitian for more options. What foods can affect my calcium intake? It may be more difficult for your body to use calcium or calcium may leave your body more quickly if you consume large amounts of: Sodium. Protein. Caffeine. Alcohol.  This information is not intended to replace advice given to you by your health care provider. Make sure you discuss any questions you have with your health care provider. Document Released: 04/05/2004 Document Revised: 03/11/2016 Document Reviewed: 01/28/2014 Elsevier Interactive Patient Education  2018 [employer].

## 2024-02-26 NOTE — Progress Notes (Signed)
 Subjective  Chief Complaint  Patient presents with   Annual Exam    Pt here for Annual exam and is not currently fasting     HPI: Shelley Williams is a 48 y.o. female who presents to [facility] [place] today for a Female Wellness Visit.   Wellness Visit: annual visit with health maintenance review and exam  HM: had + HR HPV and nl pap last year. Repeat in 3 years due 2027. Colonoscopy w/ hyperplastic polyp in 2023, due again in 2033. Doing well. Started again.  Now up to half pack per day.  Would like to quit.  Has tried patches and behavioral strategies without success.  Inquires about Chantix.  Assessment  1. Annual physical exam   2. Family history of breast cancer in mother   3. Cigar smoker motivated to quit      Plan  Female Wellness Visit: Age appropriate Health Maintenance and Prevention measures were discussed with patient. Included topics are cancer screening recommendations, ways to keep healthy (see AVS) including dietary and exercise recommendations, regular eye and dental care, use of seat belts, and avoidance of moderate alcohol use and tobacco use.  BMI: discussed patient's BMI and encouraged positive lifestyle modifications to help get to or maintain a target BMI. HM needs and immunizations were addressed and ordered. See below for orders. See HM and immunization section for updates. Routine labs and screening tests ordered including cmp, cbc and lipids where appropriate. Discussed recommendations regarding Vit D and calcium supplementation (see AVS) Discussed smoking cessation.  Counseling done.  Chantix prescribed education given on risks, precautions and monitoring of mood  Follow up: 12 mo for cpe  Orders Placed This Encounter  Procedures   CBC with Differential/Platelet   Comprehensive metabolic panel with GFR   Lipid panel   TSH   Meds ordered this encounter  Medications   Varenicline Tartrate, Starter, (CHANTIX STARTING MONTH PAK) 0.5  MG X 11 & 1 MG X 42 TBPK    Sig: Take as directed    Dispense:  1 each    Refill:  0      Body mass index is 22.67 kg/m. Wt Readings from Last 3 Encounters:  02/26/24 130 lb (59 kg)  11/06/23 129 lb 9.6 oz (58.8 kg)  03/13/23 137 lb 3.2 oz (62.2 kg)     Patient Active Problem List   Diagnosis Date Noted   Colon polyp, hyperplastic 02/17/2023    Dr. Stacia, colonoscopy 2023; repeat in 10 years    Seasonal allergic rhinitis due to pollen 02/11/2022   Family history of breast cancer in mother 07/18/2019    Mom age 13, 2020    Atopic dermatitis 01/11/2019   Herpes simplex type 2 infection 01/01/2018    Rare outbreak      Cigar smoker motivated to quit 01/01/2018    Light smoker; quit 06/2022 Restarted 2024    Pruritus 01/01/2018   Health Maintenance  Topic Date Due   COVID-19 Vaccine (1 - 2024-25 season) 03/13/2024 (Originally 05/07/2023)   INFLUENZA VACCINE  04/05/2024   DTaP/Tdap/Td (4 - Td or Tdap) 11/17/2024   Cervical Cancer Screening (HPV/Pap Cotest)  02/16/2026   Colonoscopy  08/12/2032   Hepatitis C Screening  Completed   HIV Screening  Completed   HPV VACCINES  Aged Out   Meningococcal B Vaccine  Aged Out   Immunization History  Administered Date(s) Administered   Hepatitis B, PED/ADOLESCENT 05/30/2014   Influenza, Seasonal, Injecte, Preservative  Fre 06/05/2016   Influenza,inj,Quad PF,6+ Mos 08/03/2022   Influenza,inj,quad, With Preservative 06/05/2014   Tdap 09/05/2010, 06/03/2011, 11/18/2014   We updated and reviewed the patient's past history in detail and it is documented below. Allergies: Patient has no known allergies. Past Medical History Patient  has a past medical history of Allergy, Benign gestational thrombocytopenia (HCC) (01/01/2018), Family history of breast cancer in mother (07/18/2019), HSV (herpes simplex virus) infection, and Postpartum depression (01/01/2018). Past Surgical History Patient  has a past surgical history that  includes Wisdom tooth extraction; Tubal ligation (Bilateral, 02/01/2015); and Mouth surgery. Family History: Patient family history includes Arthritis in her mother; Asthma in her mother; Breast cancer (age of onset: 58) in her mother; Colon polyps in her mother; GER disease in her father; Glaucoma in her father; Healthy in her daughter and son; Heart disease in her maternal grandmother; Hyperlipidemia in her father; Hypertension in her father and mother; Lung cancer in her paternal grandfather; Pancreatic cancer in her father. Social History:  Patient  reports that she quit smoking about 20 months ago. Her smoking use included cigarettes. She has been exposed to tobacco smoke. She has never used smokeless tobacco. She reports current alcohol use. She reports that she does not use drugs.  Review of Systems: Constitutional: negative for fever or malaise Ophthalmic: negative for photophobia, double vision or loss of vision Cardiovascular: negative for chest pain, dyspnea on exertion, or new LE swelling Respiratory: negative for SOB or persistent cough Gastrointestinal: negative for abdominal pain, change in bowel habits or melena Genitourinary: negative for dysuria or gross hematuria, no abnormal uterine bleeding or disharge Musculoskeletal: negative for new gait disturbance or muscular weakness Integumentary: negative for new or persistent rashes, no breast lumps Neurological: negative for TIA or stroke symptoms Psychiatric: negative for SI or delusions Allergic/Immunologic: negative for hives  Patient Care Team    Relationship Specialty Notifications Start End  Jodie Lavern CROME, MD PCP - General Family Medicine  01/01/18     Objective  Vitals: BP 111/79   Pulse 75   Temp 98.1 F (36.7 C)   Ht 5' 3.5 (1.613 m)   Wt 130 lb (59 kg)   SpO2 100%   BMI 22.67 kg/m  General:  Well developed, well nourished, no acute distress  Psych:  Alert and orientedx3,normal mood and affect HEENT:   Normocephalic, atraumatic, non-icteric sclera,  supple neck without adenopathy, mass or thyromegaly Cardiovascular:  Normal S1, S2, RRR without gallop, rub or murmur Respiratory:  Good breath sounds bilaterally, CTAB with normal respiratory effort Gastrointestinal: normal bowel sounds, soft, non-tender, no noted masses. No HSM MSK: extremities without edema, joints without erythema or swelling  Commons side effects, risks, benefits, and alternatives for medications and treatment plan prescribed today were discussed, and the patient expressed understanding of the given instructions. Patient is instructed to call or message via MyChart if he/she has any questions or concerns regarding our treatment plan. No barriers to understanding were identified. We discussed Red Flag symptoms and signs in detail. Patient expressed understanding regarding what to do in case of urgent or emergency type symptoms.  Medication list was reconciled, printed and provided to the patient in AVS. Patient instructions and summary information was reviewed with the patient as documented in the AVS. This note was prepared with assistance of Dragon voice recognition software. Occasional wrong-word or sound-a-like substitutions may have occurred due to the inherent limitations of voice recognition software

## 2024-02-27 ENCOUNTER — Ambulatory Visit: Payer: Self-pay | Admitting: Family Medicine

## 2024-02-27 NOTE — Progress Notes (Signed)
 Labs reviewed.  The 10-year ASCVD risk score (Arnett DK, et al., 2019) is: 0.6%   Values used to calculate the score:     Age: 48 years     Clincally relevant sex: Female     Is Non-Hispanic African American: Yes     Diabetic: No     Tobacco smoker: No     Systolic Blood Pressure: 111 mmHg     Is BP treated: No     HDL Cholesterol: 74.2 mg/dL     Total Cholesterol: 267 mg/dL

## 2024-03-20 ENCOUNTER — Encounter: Payer: Self-pay | Admitting: Family Medicine

## 2024-03-20 ENCOUNTER — Other Ambulatory Visit: Payer: Self-pay

## 2024-03-20 MED ORDER — FLUCONAZOLE 150 MG PO TABS: 150.0000 mg | ORAL_TABLET | Freq: Every day | ORAL | 0 refills | Status: DC

## 2024-05-02 DIAGNOSIS — H5203 Hypermetropia, bilateral: Secondary | ICD-10-CM | POA: Diagnosis not present

## 2024-05-20 ENCOUNTER — Encounter: Payer: Self-pay | Admitting: Family Medicine

## 2024-05-23 ENCOUNTER — Ambulatory Visit: Admitting: Family Medicine

## 2024-05-23 ENCOUNTER — Encounter: Payer: Self-pay | Admitting: Family Medicine

## 2024-05-23 VITALS — BP 113/69 | HR 75 | Temp 98.1°F | Ht 63.5 in | Wt 136.2 lb

## 2024-05-23 DIAGNOSIS — N951 Menopausal and female climacteric states: Secondary | ICD-10-CM | POA: Diagnosis not present

## 2024-05-23 DIAGNOSIS — F17200 Nicotine dependence, unspecified, uncomplicated: Secondary | ICD-10-CM | POA: Diagnosis not present

## 2024-05-23 LAB — FOLLICLE STIMULATING HORMONE: FSH: 46.2 m[IU]/mL

## 2024-05-23 MED ORDER — PROGESTERONE MICRONIZED 100 MG PO CAPS: 100.0000 mg | ORAL_CAPSULE | Freq: Every day | ORAL | 5 refills | Status: AC

## 2024-05-23 NOTE — Progress Notes (Signed)
 Subjective  CC:  Chief Complaint  Patient presents with   Hot Flashes    Pt stated that she is going thru menopause bc she has not had a period, having hot flashes, sweats in the night and can not sleep    HPI: Shelley Williams is a 48 y.o. female who presents to the office today to address the problems listed above in the chief complaint. Discussed the use of AI scribe software for clinical note transcription with the patient, who gave verbal consent to proceed.  History of Present Illness Shelley Williams is a 49 year old female who presents with hot flashes and irregular periods.  She experiences hot flashes that start from her hips and extend to the top of her head, occurring approximately three to four times a day and sometimes at night, though they do not wake her. She describes feeling 'hot' and notes that her daughter remarked she was 'on fire.'  She has been experiencing difficulty sleeping, waking up in the middle of the night and being unable to return to sleep. This is a new symptom for her.  She missed her period, which was due on September 5th, with her last period starting on August 5th. Her menstrual cycles have been regular until this point, and this is her first skipped period. She has a history of tubal ligation, so she does not believe pregnancy is a possibility.  She reports a decrease in sexual urges and no pain with intercourse or urinary symptoms. She also mentions occasional emotional changes and possible brain fog.  She continues to smoke cigarettes, approximately four per day, with an increase when consuming alcohol. Her family history is significant for her mother having had estrogen-fed breast cancer, for which her mother is currently on an anti-estrogen pill.   Assessment  1. Perimenopausal symptoms   2. Tobacco dependence      Plan  Assessment and Plan Assessment & Plan Perimenopausal symptoms Symptoms consistent with perimenopause. HRT discussed.  Non-hormonal options and progesterone  considered. Smoking cessation advised due to cardiovascular risks  - Prescribe nightly progesterone   100mg  at bedtime pill for 1-3 months for sleep and hot flashes. - Order blood work to assess hormone levels. - Consider adding estrogen or alternatives if progesterone  ineffective. (Ssri, gabapentin, cycled hrt or ocp if stops smoking) - Advise smoking cessation.  Tobacco use Smokes four cigarettes daily, more with alcohol. Smoking increases cardiovascular risks, especially with hormone therapy. Desires to quit and has reduced intake. - Advise smoking cessation.    Follow up: 3 mo Orders Placed This Encounter  Procedures   Follicle stimulating hormone   Estradiol    Testosterone ,Free and Total   Progesterone    POCT urine pregnancy   Meds ordered this encounter  Medications   progesterone  (PROMETRIUM ) 100 MG capsule    Sig: Take 1 capsule (100 mg total) by mouth daily.    Dispense:  30 capsule    Refill:  5     I reviewed the patients updated PMH, FH, and SocHx.  Patient Active Problem List   Diagnosis Date Noted   Colon polyp, hyperplastic 02/17/2023   Seasonal allergic rhinitis due to pollen 02/11/2022   Family history of breast cancer in mother 07/18/2019   Atopic dermatitis 01/11/2019   Herpes simplex type 2 infection 01/01/2018   Cigar smoker motivated to quit 01/01/2018   Pruritus 01/01/2018   Current Meds  Medication Sig   ELDERBERRY PO Take by mouth.   fluconazole  (DIFLUCAN ) 150 MG  tablet Take 1 tablet (150 mg total) by mouth daily.   OVER THE COUNTER MEDICATION Sea-moss   progesterone  (PROMETRIUM ) 100 MG capsule Take 1 capsule (100 mg total) by mouth daily.   triamcinolone  cream (KENALOG ) 0.1 % Apply 1 Application topically 2 (two) times daily as needed.   Varenicline  Tartrate, Starter, (CHANTIX  STARTING MONTH PAK) 0.5 MG X 11 & 1 MG X 42 TBPK Take as directed   Allergies: Patient has no known allergies. Family  History: Patient family history includes Arthritis in her mother; Asthma in her mother; Breast cancer (age of onset: 65) in her mother; Colon polyps in her mother; GER disease in her father; Glaucoma in her father; Healthy in her daughter and son; Heart disease in her maternal grandmother; Hyperlipidemia in her father; Hypertension in her father and mother; Lung cancer in her paternal grandfather; Pancreatic cancer in her father. Social History:  Patient  reports that she quit smoking about 23 months ago. Her smoking use included cigarettes. She has been exposed to tobacco smoke. She has never used smokeless tobacco. She reports current alcohol use. She reports that she does not use drugs.  Review of Systems: Constitutional: Negative for fever malaise or anorexia Cardiovascular: negative for chest pain Respiratory: negative for SOB or persistent cough Gastrointestinal: negative for abdominal pain  Objective  Vitals: BP 113/69   Pulse 75   Temp 98.1 F (36.7 C)   Ht 5' 3.5 (1.613 m)   Wt 136 lb 3.2 oz (61.8 kg)   SpO2 98%   BMI 23.75 kg/m  General: no acute distress , A&Ox3  Commons side effects, risks, benefits, and alternatives for medications and treatment plan prescribed today were discussed, and the patient expressed understanding of the given instructions. Patient is instructed to call or message via MyChart if he/she has any questions or concerns regarding our treatment plan. No barriers to understanding were identified. We discussed Red Flag symptoms and signs in detail. Patient expressed understanding regarding what to do in case of urgent or emergency type symptoms.  Medication list was reconciled, printed and provided to the patient in AVS. Patient instructions and summary information was reviewed with the patient as documented in the AVS. This note was prepared with assistance of Dragon voice recognition software. Occasional wrong-word or sound-a-like substitutions may have  occurred due to the inherent limitations of voice recognition software

## 2024-05-23 NOTE — Patient Instructions (Signed)
 Please follow up if symptoms do not improve or as needed.    Perimenopause: What to Know Perimenopause is the time in your life when your levels of estrogen start to go down. Estrogen is the female hormone made by your ovaries. Perimenopause can start 2-8 years before menopause. It can cause changes to your menstrual period. During this time, your ovaries may or may not make an egg. In many cases, you can still get pregnant. What are the causes? Perimenopause is a natural change in your homone levels that happens as you get older. What increases the risk? You're more likely to start perimenopause early if: You have an abnormal growth (tumor) of the pituitary gland in your brain. You have a disease that affects your ovaries. You've had certain treatments for cancer. These include: Chemotherapy. Hormone therapy. Radiation therapy on the area between your hips (pelvis). You smoke a lot or drink a lot of alcohol. Other family members have gone through menopause early. What are the signs or symptoms? Symptoms are unique to each person. You may have: Hot flashes. Irregular periods. Night sweats. Changes in how you feel about sex. You may have less of a sex drive or feel more discomfort around your sexuality. Vaginal dryness. Headaches. Mood swings. Other symptoms may include: Depression. This is when you feel sad or hopeless. Trouble sleeping. Memory problems or trouble focusing. Irritability. This means getting annoyed easily. Tiredness. Weight gain. Anxiety. This is feeling worried or nervous. You can also have trouble getting pregnant. How is this diagnosed? You may be diagnosed based on: Your medical history. An exam. Your age. Your history of menstrual periods. Your symptoms. Hormone tests. How is this treated? In some cases, no treatment is needed. Talk with your health care provider about if you should get treated. Treatments may include: Menopausal hormone therapy  (MHT). Medicines to treat certain symptoms. Acupuncture. Vitamin or herbal supplements. Before you start treatment, let your provider know if you or anyone in your family has or has had: Heart disease. Breast cancer. Blood clots. Diabetes. Osteoporosis. Follow these instructions at home: Eating and drinking  Eat a balanced diet. It should include: Fresh fruits and vegetables. Whole grains. Soybeans. Eggs. Lean meat. Low-fat dairy. To help prevent hot flashes, stay away from: Alcohol. Drinks with caffeine in them. Spicy foods. Lifestyle Do not smoke, vape, or use nicotine or tobacco. Get at least 30 minutes of physical activity on 5 or more days each week. Get 7-8 hours of sleep each night. Dress in layers that can be taken off if you have a hot flash. Find ways to manage stress. You may want to try: Deep breathing. Meditation. Writing in a journal. General instructions  Take your medicines only as told. Keep track of your periods. Track: When they happen. How heavy they are. How long they last. How much time passes between periods. Keep track of your symptoms. Track: When they start. How often you have them. How long they last. Use vaginal lubricants or moisturizers. These can help with: Vaginal dryness. Comfort during sex. You can still get pregnant if you're having any periods. Make sure you use birth control if you don't want to get pregnant. Contact a health care provider if: You have a very heavy period or pass blood clots. Your period lasts more than 2 days longer than normal. Your period comes back sooner than 21 days. You bleed after having sex. You have pain during sex. You have pain when you pee. You get very  bad headaches. You have trouble with your eyesight. Get help right away if: You have chest pain. You have trouble breathing. You have trouble talking. You have very bad depression. This information is not intended to replace advice given  to you by your health care provider. Make sure you discuss any questions you have with your health care provider. Document Revised: 04/27/2023 Document Reviewed: 04/27/2023 Elsevier Patient Education  2024 [employer].

## 2024-05-24 LAB — PROGESTERONE: Progesterone: <0.5 ng/mL

## 2024-05-24 LAB — ESTRADIOL: Estradiol: 27 pg/mL

## 2024-05-25 ENCOUNTER — Ambulatory Visit: Payer: Self-pay | Admitting: Family Medicine

## 2024-05-25 NOTE — Progress Notes (Signed)
 See mychart note Dear Ms. Flood, Your lab values show that your hormone levels are all very low consistent with perimenopause entering menopause.  Try the progesterone  and let me know if you feel better. We can add other hormones in the future if needed. Let's recheck how you are doing in the office in 3 months.  Sincerely, Dr. Jodie

## 2024-05-28 LAB — TESTOSTERONE,FREE AND TOTAL
Testosterone, Free: 0.8 pg/mL (ref 0.0–4.2)
Testosterone: 14 ng/dL (ref 4–50)

## 2024-08-14 ENCOUNTER — Encounter: Payer: Self-pay | Admitting: Family Medicine

## 2024-08-19 ENCOUNTER — Encounter: Payer: Self-pay | Admitting: Family Medicine

## 2024-08-19 MED ORDER — FLUCONAZOLE 150 MG PO TABS: 150.0000 mg | ORAL_TABLET | Freq: Every day | ORAL | 0 refills | Status: DC

## 2024-08-19 NOTE — Telephone Encounter (Signed)
 Spoke with patient directly, has HX of reoccurring yeast infections. Symptoms are discharge and itching. Requesting a yeast medication be sent int. Thank you.

## 2024-08-22 ENCOUNTER — Encounter: Payer: Self-pay | Admitting: Family Medicine

## 2024-08-22 ENCOUNTER — Ambulatory Visit: Admitting: Family Medicine

## 2024-08-22 VITALS — BP 102/60 | HR 80 | Temp 97.5°F | Ht 63.25 in | Wt 142.2 lb

## 2024-08-22 DIAGNOSIS — N951 Menopausal and female climacteric states: Secondary | ICD-10-CM | POA: Diagnosis not present

## 2024-08-22 DIAGNOSIS — Z79890 Hormone replacement therapy (postmenopausal): Secondary | ICD-10-CM

## 2024-08-22 DIAGNOSIS — F17290 Nicotine dependence, other tobacco product, uncomplicated: Secondary | ICD-10-CM

## 2024-08-22 DIAGNOSIS — E7800 Pure hypercholesterolemia, unspecified: Secondary | ICD-10-CM | POA: Diagnosis not present

## 2024-08-22 NOTE — Progress Notes (Signed)
 Subjective  CC:  Chief Complaint  Patient presents with   Follow-up    3 Month medication follow-up for perimenopausal. Has not had a hot flash since starting the meds.     HPI: Shelley Williams is a 48 y.o. female who presents to the office today to address the problems listed above in the chief complaint. Discussed the use of AI scribe software for clinical note transcription with the patient, who gave verbal consent to proceed.  History of Present Illness Shelley Williams is a 47 year old female who presents with weight gain and elevated cholesterol.  Weight gain - Recent increase of approximately 4-5 pounds, current weight 142 pounds - Weight has fluctuated in the past, ranging from the 120s to the 140s - No significant change in eating habits, but increased intake during the holiday season  Hyperlipidemia - Recent elevation in cholesterol levels on last blood test - Currently taking a daily fish oil supplement - Increased consumption of fast food for convenience, including [employer] and [employer] Lab Results  Component Value Date   CHOL 267 (H) 02/26/2024   CHOL 269 (H) 02/17/2023   CHOL 195 02/11/2022   Lab Results  Component Value Date   HDL 74.20 02/26/2024   HDL 96.30 02/17/2023   HDL 77.90 02/11/2022   Lab Results  Component Value Date   LDLCALC 170 (H) 02/26/2024   LDLCALC 160 (H) 02/17/2023   LDLCALC 109 (H) 02/11/2022   Lab Results  Component Value Date   TRIG 113.0 02/26/2024   TRIG 60.0 02/17/2023   TRIG 41.0 02/11/2022   Lab Results  Component Value Date   CHOLHDL 4 02/26/2024   CHOLHDL 3 02/17/2023   CHOLHDL 3 02/11/2022   No results found for: LDLDIRECT The 10-year ASCVD risk score (Arnett DK, et al., 2019) is: 0.5%   Values used to calculate the score:     Age: 15 years     Clinically relevant sex: Female     Is Non-Hispanic African American: Yes     Diabetic: No     Tobacco smoker: No     Systolic Blood Pressure: 102 mmHg     Is BP  treated: No     HDL Cholesterol: 74.2 mg/dL     Total Cholesterol: 267 mg/dL  Menopausal symptoms and hormone therapy - On progesterone  therapy with significant improvement in symptoms - Cessation of hot flashes - Improved sleep quality - Improved mood, increased calmness, and reduced anxiety  Menstrual irregularity - Menstrual cycle irregular, with two periods occurring in one month. Light bleeding. No pelvic pain  Tobacco use - History of smoking - Plans to resume Chantix  after the holidays to quit smoking - still motivated to quit but barrier: holiday festivities   Assessment  1. Perimenopausal symptoms   2. Hormone replacement therapy (HRT)   3. Cigar smoker motivated to quit   4. Hypercholesterolemia      Plan  Assessment and Plan Assessment & Plan  Perimenopausal symptoms Significant improvement in perimenopausal symptoms with progesterone  therapy, including resolution of hot flashes, improved sleep, mood, and skin condition. Irregular menstrual cycles are manageable and expected to stabilize over time. Potential for irregular cycles to continue for up to four more years, but symptoms are currently manageable. - Continue progesterone  therapy. - Monitor menstrual cycle for significant changes or unmanageable symptoms.  Hypercholesterolemia Cholesterol levels are elevated but not at a level requiring medication. Likely related to dietary habits, particularly increased fast food and fried food  consumption. Fish oil supplementation is beneficial for triglycerides and overall health. - Advised dietary modifications to reduce fast food and fried food intake. - Encouraged increased consumption of vegetables and less processed foods. - Continue fish oil supplementation. - Encouraged healthy eating habits, including more vegetables and less processed foods. - Advised regular exercise.  Nicotine dependence Continued nicotine dependence with recent lapse in smoking cessation  efforts due to holiday stress and social situations. Plans to resume Chantix  after the holidays. - Encouraged resumption of Chantix  after the holidays. - Supported smoking cessation efforts.    Follow up: June for cpe No orders of the defined types were placed in this encounter.  No orders of the defined types were placed in this encounter.    I reviewed the patients updated PMH, FH, and SocHx.  Patient Active Problem List   Diagnosis Date Noted   Colon polyp, hyperplastic 02/17/2023   Seasonal allergic rhinitis due to pollen 02/11/2022   Family history of breast cancer in mother 07/18/2019   Atopic dermatitis 01/11/2019   Herpes simplex type 2 infection 01/01/2018   Cigar smoker motivated to quit 01/01/2018   Pruritus 01/01/2018   Active Medications[1] Allergies: Patient has no known allergies. Family History: Patient family history includes Arthritis in her mother; Asthma in her mother; Breast cancer (age of onset: 60) in her mother; Colon polyps in her mother; GER disease in her father; Glaucoma in her father; Healthy in her daughter and son; Heart disease in her maternal grandmother; Hyperlipidemia in her father; Hypertension in her father and mother; Lung cancer in her paternal grandfather; Pancreatic cancer in her father. Social History:  Patient  reports that she quit smoking about 2 years ago. Her smoking use included cigarettes. She smoked an average of 0.5 packs per day. She has been exposed to tobacco smoke. She has never used smokeless tobacco. She reports current alcohol use. She reports that she does not use drugs.  Review of Systems: Constitutional: Negative for fever malaise or anorexia Cardiovascular: negative for chest pain Respiratory: negative for SOB or persistent cough Gastrointestinal: negative for abdominal pain  Objective  Vitals: BP 102/60 (BP Location: Left Arm, Patient Position: Sitting, Cuff Size: Normal)   Pulse 80   Temp (!) 97.5 F (36.4 C)  (Temporal)   Ht 5' 3.25 (1.607 m)   Wt 142 lb 3.2 oz (64.5 kg)   LMP 08/14/2024 (Approximate)   SpO2 99%   BMI 24.99 kg/m  General: no acute distress , A&Ox3  Commons side effects, risks, benefits, and alternatives for medications and treatment plan prescribed today were discussed, and the patient expressed understanding of the given instructions. Patient is instructed to call or message via MyChart if he/she has any questions or concerns regarding our treatment plan. No barriers to understanding were identified. We discussed Red Flag symptoms and signs in detail. Patient expressed understanding regarding what to do in case of urgent or emergency type symptoms.  Medication list was reconciled, printed and provided to the patient in AVS. Patient instructions and summary information was reviewed with the patient as documented in the AVS. This note was prepared with assistance of Dragon voice recognition software. Occasional wrong-word or sound-a-like substitutions may have occurred due to the inherent limitations of voice recognition software    [1]  Current Meds  Medication Sig   ELDERBERRY PO Take by mouth.   fluconazole  (DIFLUCAN ) 150 MG tablet Take 1 tablet (150 mg total) by mouth daily.   Multiple Vitamins-Minerals (MULTIPLE VITAMINS/WOMENS PO) Take  by mouth.   Omega-3 Fatty Acids (FISH OIL PO) Take by mouth.   OVER THE COUNTER MEDICATION Sea-moss   progesterone  (PROMETRIUM ) 100 MG capsule Take 1 capsule (100 mg total) by mouth daily.   saccharomyces boulardii (FLORASTOR) 250 MG capsule Take 250 mg by mouth 2 (two) times daily.   triamcinolone  cream (KENALOG ) 0.1 % Apply 1 Application topically 2 (two) times daily as needed.

## 2024-08-22 NOTE — Patient Instructions (Addendum)
 Please return in 6 months for your annual complete physical; please come fasting.    If you have any questions or concerns, please don't hesitate to send me a message via MyChart or call the office at [phone]. Thank you for visiting with us  today! It's our pleasure caring for you.    VISIT SUMMARY: Today, we discussed your recent weight gain, elevated cholesterol levels, and menopausal symptoms. We also reviewed your smoking cessation plans and overall wellness.  YOUR PLAN: -WEIGHT GAIN: You have gained about 4-5 pounds recently, likely due to holiday eating habits. We discussed the importance of maintaining a healthy diet and regular exercise to manage your weight. Please focus on eating more vegetables and less processed foods, and try to incorporate regular physical activity into your routine.  -PERIMENOPAUSAL SYMPTOMS: Your perimenopausal symptoms have significantly improved with progesterone  therapy, including the cessation of hot flashes and better sleep and mood. Irregular menstrual cycles are expected and manageable. Continue with your current progesterone  therapy and monitor for any significant changes.  -HYPERCHOLESTEROLEMIA: Your cholesterol levels are elevated, likely due to increased consumption of fast food and fried foods. While your levels do not currently require medication, it is important to make dietary changes. Please reduce your intake of fast food and fried foods, and increase your consumption of vegetables and less processed foods. Continue taking your fish oil supplement.  -NICOTINE DEPENDENCE: You have a history of smoking and plan to resume Chantix  after the holidays to help quit smoking. It is important to continue with your smoking cessation efforts. We support your plan to restart Chantix  and encourage you to stay committed to quitting smoking.  INSTRUCTIONS: Please follow up as needed for any significant changes in your symptoms or if you have any concerns.  Continue with your current medications and lifestyle modifications. If you need additional support for smoking cessation, please reach out.                      Contains text generated by [employer].                                 Contains text generated by [employer].

## 2024-09-03 ENCOUNTER — Encounter: Payer: Self-pay | Admitting: Family Medicine

## 2024-09-10 ENCOUNTER — Other Ambulatory Visit (INDEPENDENT_AMBULATORY_CARE_PROVIDER_SITE_OTHER)

## 2024-09-10 ENCOUNTER — Encounter

## 2024-09-10 DIAGNOSIS — Z111 Encounter for screening for respiratory tuberculosis: Secondary | ICD-10-CM

## 2024-09-12 ENCOUNTER — Ambulatory Visit

## 2024-09-12 ENCOUNTER — Ambulatory Visit: Payer: Self-pay | Admitting: Family Medicine

## 2024-09-12 LAB — QUANTIFERON-TB GOLD PLUS
Mitogen-NIL: >10 [IU]/mL
NIL: 0.02 [IU]/mL
QuantiFERON-TB Gold Plus: NEGATIVE
TB1-NIL: 0 [IU]/mL
TB2-NIL: 0 [IU]/mL

## 2024-09-12 NOTE — Progress Notes (Signed)
"  See mychart note  "

## 2024-09-22 NOTE — Progress Notes (Unsigned)
 error

## 2024-09-28 ENCOUNTER — Encounter: Payer: Self-pay | Admitting: Family Medicine

## 2024-09-30 MED ORDER — FLUCONAZOLE 150 MG PO TABS: ORAL_TABLET | ORAL | 0 refills | Status: AC

## 2025-02-26 ENCOUNTER — Encounter: Admitting: Family Medicine
